# Patient Record
Sex: Male | Born: 1975 | Race: White | Hispanic: No | Marital: Single | Smoking: Never smoker
Health system: Southern US, Community
[De-identification: ages and names within clinical notes are randomized; demographics above are authoritative.]

## PROBLEM LIST (undated history)

## (undated) DIAGNOSIS — H9313 Tinnitus, bilateral: Secondary | ICD-10-CM

## (undated) DIAGNOSIS — H9190 Unspecified hearing loss, unspecified ear: Secondary | ICD-10-CM

## (undated) DIAGNOSIS — E119 Type 2 diabetes mellitus without complications: Secondary | ICD-10-CM

## (undated) DIAGNOSIS — I10 Essential (primary) hypertension: Secondary | ICD-10-CM

## (undated) DIAGNOSIS — R42 Dizziness and giddiness: Secondary | ICD-10-CM

## (undated) HISTORY — DX: Unspecified hearing loss, unspecified ear: H91.90

## (undated) HISTORY — PX: CARDIAC ELECTROPHYSIOLOGY MAPPING AND ABLATION: SHX1292

## (undated) HISTORY — DX: Type 2 diabetes mellitus without complications: E11.9

## (undated) HISTORY — DX: Tinnitus, bilateral: H93.13

## (undated) HISTORY — DX: Dizziness and giddiness: R42

---

## 2008-03-24 ENCOUNTER — Emergency Department (HOSPITAL_COMMUNITY): Admission: EM | Admit: 2008-03-24 | Discharge: 2008-03-24 | Payer: Self-pay | Admitting: Emergency Medicine

## 2009-03-16 ENCOUNTER — Ambulatory Visit (HOSPITAL_COMMUNITY): Admission: RE | Admit: 2009-03-16 | Discharge: 2009-03-16 | Payer: Self-pay | Admitting: Orthopaedic Surgery

## 2011-09-17 LAB — URIC ACID: Uric Acid, Serum: 9 — ABNORMAL HIGH

## 2012-07-26 ENCOUNTER — Encounter (HOSPITAL_COMMUNITY): Payer: Self-pay | Admitting: Emergency Medicine

## 2012-07-26 ENCOUNTER — Emergency Department (HOSPITAL_COMMUNITY)
Admission: EM | Admit: 2012-07-26 | Discharge: 2012-07-26 | Disposition: A | Discharge status: Home / Self Care | Payer: BC Managed Care – PPO | Attending: Emergency Medicine | Admitting: Emergency Medicine

## 2012-07-26 ENCOUNTER — Emergency Department (HOSPITAL_COMMUNITY): Payer: BC Managed Care – PPO

## 2012-07-26 DIAGNOSIS — M109 Gout, unspecified: Secondary | ICD-10-CM

## 2012-07-26 LAB — URIC ACID: Uric Acid, Serum: 10.8 mg/dL — ABNORMAL HIGH (ref 4.0–7.8)

## 2012-07-26 MED ORDER — DEXAMETHASONE SODIUM PHOSPHATE 10 MG/ML IJ SOLN
10.0000 mg | Freq: Once | INTRAMUSCULAR | Status: AC
  Administered 2012-07-26: 10 mg via INTRAMUSCULAR
  Filled 2012-07-26: qty 1

## 2012-07-26 MED ORDER — HYDROCODONE-ACETAMINOPHEN 5-325 MG PO TABS: 1.0000 | ORAL_TABLET | ORAL | Status: AC | PRN

## 2012-07-26 MED ORDER — HYDROCODONE-ACETAMINOPHEN 5-325 MG PO TABS
1.0000 | ORAL_TABLET | Freq: Once | ORAL | Status: AC
  Administered 2012-07-26: 1 via ORAL
  Filled 2012-07-26: qty 1

## 2012-07-26 MED ORDER — PREDNISONE 10 MG PO TABS: ORAL_TABLET | ORAL | Status: DC

## 2012-07-26 NOTE — ED Notes (Signed)
C/o r wrist pain x 3 days. Denies injury but has been playing golf a lot. Slight posterior wrist swelling noted. Radial pulses strong. Cap refill imeediate. Nad.

## 2012-07-26 NOTE — ED Notes (Signed)
Pt c/o pain and swelling to his right wrist for 2 days. Denies injury. Strong pulse palpated. Pt c/o pain with movement. Pt alert and oriented x 3. Skin warm and dry. Color pink. No acute distress.

## 2012-07-26 NOTE — ED Provider Notes (Signed)
History     CSN: [id number]  Arrival date & time 07/26/12  1326   First MD Initiated Contact with Patient 07/26/12 1435      Chief Complaint  Patient presents with  . Wrist Pain    (Consider location/radiation/quality/duration/timing/severity/associated sxs/prior treatment) HPI Comments: Brett Rosario presents with right wrist pain, swelling and increasing redness over the past 3 days.  He denies injury, but has been on vacation, and playing more golf this week. Pain is constant and achy.  He does possibly have a history of gout, stating a history of swelling in his left ankle which has been intermittent along with prior elevated uric acid level.  He has taken aleve with no relief of symptoms.    The history is provided by the patient.    History reviewed. No pertinent past medical history.  History reviewed. No pertinent past surgical history.  History reviewed. No pertinent family history.  History  Substance Use Topics  . Smoking status: Never Smoker   . Smokeless tobacco: Not on file  . Alcohol Use: Yes     occasional      Review of Systems  Musculoskeletal: Positive for joint swelling and arthralgias.  Skin: Negative for wound.  Neurological: Negative for weakness and numbness.    Allergies  Review of patient's allergies indicates no known allergies.  Home Medications   Current Outpatient Rx  Name Route Sig Dispense Refill  . LISINOPRIL-HYDROCHLOROTHIAZIDE 20-12.5 MG PO TABS Oral Take 1 tablet by mouth daily.    Marland Kitchen NAPROXEN SODIUM 220 MG PO TABS Oral Take 220 mg by mouth every 8 (eight) hours as needed. For pain    . SERTRALINE HCL 50 MG PO TABS Oral Take 75 mg by mouth daily.    Marland Kitchen HYDROCODONE-ACETAMINOPHEN 5-325 MG PO TABS Oral Take 1 tablet by mouth every 4 (four) hours as needed for pain. 15 tablet 0  . PREDNISONE 10 MG PO TABS  6, 5, 4, 3, 2 then 1 tablet by mouth daily for 6 days total. 21 tablet 0    BP 131/75  Pulse 84  Temp 98.2 F (36.8 C)   Resp 17  SpO2 94%  Physical Exam  Constitutional: He appears well-developed and well-nourished.  HENT:  Head: Atraumatic.  Neck: Normal range of motion.  Cardiovascular:       Pulses equal bilaterally  Musculoskeletal: He exhibits edema and tenderness.       Right wrist: He exhibits tenderness and swelling.       Slight edema,  Ttp, increased erythema over right dorsal wrist.  Pt does display extension and flexion,  Although flexion is reduced secondary to pain.  Cap refill is less than 3 seconds.  He has no rash, no punctures, abrasions or other sign of skin trauma.    Neurological: He is alert. He has normal strength. He displays normal reflexes. No sensory deficit.       Equal strength  Skin: Skin is warm and dry.  Psychiatric: He has a normal mood and affect.    ED Course  Procedures (including critical care time)  Labs Reviewed  URIC ACID - Abnormal; Notable for the following:    Uric Acid, Serum 10.8 (*)     All other components within normal limits   Dg Wrist Complete Right  07/26/2012  *RADIOLOGY REPORT*  Clinical Data: Wrist pain  RIGHT WRIST - COMPLETE 3+ VIEW  Comparison: None  Findings: There is no evidence of fracture or dislocation.  There is  no evidence of arthropathy or other focal bone abnormality. Soft tissues are unremarkable.  IMPRESSION: Negative exam.  Original Report Authenticated By: Rosealee Albee, M.D.     1. Gout    Pt given decadron 10 mg IM, hydrocodone tablet prior to dc home.   MDM  Pt prescribed 6 day prednisone taper to start tomorrow, hydrocodone.  Encouraged heat therapy, elevation, rest.  Referral for pcp.  In interim, pt needs return here for any worsened sx.  No risk factors for septic joint.   Labs reviewed and discussed with pt.        Burgess Amor, [place] 07/26/12 2219

## 2012-07-30 NOTE — ED Provider Notes (Signed)
Medical screening examination/treatment/procedure(s) were performed by non-physician practitioner and as supervising physician I was immediately available for consultation/collaboration.   Joya Gaskins, MD 07/30/12 [phone]

## 2015-08-30 ENCOUNTER — Emergency Department (HOSPITAL_COMMUNITY)
Admission: EM | Admit: 2015-08-30 | Discharge: 2015-08-30 | Disposition: A | Discharge status: Home / Self Care | Payer: BLUE CROSS/BLUE SHIELD | Attending: Emergency Medicine | Admitting: Emergency Medicine

## 2015-08-30 ENCOUNTER — Encounter (HOSPITAL_COMMUNITY): Payer: Self-pay | Admitting: *Deleted

## 2015-08-30 DIAGNOSIS — M5412 Radiculopathy, cervical region: Secondary | ICD-10-CM | POA: Diagnosis not present

## 2015-08-30 DIAGNOSIS — I10 Essential (primary) hypertension: Secondary | ICD-10-CM | POA: Insufficient documentation

## 2015-08-30 DIAGNOSIS — M79602 Pain in left arm: Secondary | ICD-10-CM | POA: Diagnosis present

## 2015-08-30 DIAGNOSIS — Z79899 Other long term (current) drug therapy: Secondary | ICD-10-CM | POA: Insufficient documentation

## 2015-08-30 DIAGNOSIS — M25512 Pain in left shoulder: Secondary | ICD-10-CM | POA: Insufficient documentation

## 2015-08-30 HISTORY — DX: Essential (primary) hypertension: I10

## 2015-08-30 MED ORDER — DEXAMETHASONE SODIUM PHOSPHATE 10 MG/ML IJ SOLN
10.0000 mg | Freq: Once | INTRAMUSCULAR | Status: AC
  Administered 2015-08-30: 10 mg via INTRAMUSCULAR
  Filled 2015-08-30: qty 1

## 2015-08-30 MED ORDER — PREDNISONE 20 MG PO TABS: ORAL_TABLET | ORAL | Status: DC

## 2015-08-30 MED ORDER — HYDROCODONE-ACETAMINOPHEN 5-325 MG PO TABS: 1.0000 | ORAL_TABLET | ORAL | Status: DC | PRN

## 2015-08-30 NOTE — Discharge Instructions (Signed)

## 2015-08-30 NOTE — ED Provider Notes (Signed)
CSN: [id number]     Arrival date & time 08/30/15  0900 History  This chart was scribed for Gilda Crease, MD by Marica Otter, ED Scribe. This patient was seen in room APA08/APA08 and the patient's care was started at 9:34 AM.  Chief Complaint  Patient presents with  . Arm Pain   The history is provided by the patient. No language interpreter was used.   PCP: [facility] HPI Comments: Brett Rosario is a 39 y.o. male, with PMHx noted below, who presents to the Emergency Department complaining of atraumatic, intermittent, severe, 10/10, burning left arm pain and intermittent, improving neck pain onset 2 weeks ago. Pt reports his PCP treated him for the same with muscle relaxer-- pt reports while the muscle relaxer afforded temporary relief, his Sx returned. Thereafter, pt went to the chiropractor who was able to help his neck pain but his arm pain remains unchanged. Pt further reports icing the affected areas at home with some relief and ingesting ibuprofen at home without relief. Pt reports he has an appointment with ortho on October 4th.   Past Medical History  Diagnosis Date  . Hypertension    History reviewed. No pertinent past surgical history. No family history on file. Social History  Substance Use Topics  . Smoking status: Never Smoker   . Smokeless tobacco: None  . Alcohol Use: Yes     Comment: occasional    Review of Systems  Constitutional: Negative for fever and chills.  Musculoskeletal: Positive for arthralgias (left arm pain) and neck pain.  All other systems reviewed and are negative.  Allergies  Review of patient's allergies indicates no known allergies.  Home Medications   Prior to Admission medications   Medication Sig Start Date End Date Taking? Authorizing Provider  HYDROcodone-acetaminophen (NORCO/VICODIN) 5-325 MG per tablet Take 1-2 tablets by mouth every 4 (four) hours as needed for moderate pain. 08/30/15   Gilda Crease, MD  lisinopril-hydrochlorothiazide (PRINZIDE,ZESTORETIC) 20-12.5 MG per tablet Take 1 tablet by mouth daily.    Historical Provider, MD  naproxen sodium (ANAPROX) 220 MG tablet Take 220 mg by mouth every 8 (eight) hours as needed. For pain    Historical Provider, MD  predniSONE (DELTASONE) 20 MG tablet 3 tabs po daily x 3 days, then 2 tabs x 3 days, then 1.5 tabs x 3 days, then 1 tab x 3 days, then 0.5 tabs x 3 days 08/30/15   Gilda Crease, MD  sertraline (ZOLOFT) 50 MG tablet Take 75 mg by mouth daily.    Historical Provider, MD   Triage Vitals: BP 147/98 mmHg  Pulse 77  Temp(Src) 98.1 F (36.7 C) (Oral)  Resp 16  Ht 6' (1.829 m)  Wt 227 lb (102.967 kg)  BMI 30.78 kg/m2  SpO2 100% Physical Exam  Constitutional: He is oriented to person, place, and time. He appears well-developed and well-nourished. No distress.  HENT:  Head: Normocephalic and atraumatic.  Right Ear: Hearing normal.  Left Ear: Hearing normal.  Nose: Nose normal.  Mouth/Throat: Oropharynx is clear and moist and mucous membranes are normal.  Eyes: Conjunctivae and EOM are normal. Pupils are equal, round, and reactive to light.  Neck: Normal range of motion. Neck supple.  Cardiovascular: Regular rhythm, S1 normal and S2 normal.  Exam reveals no gallop and no friction rub.   No murmur heard. Pulmonary/Chest: Effort normal and breath sounds normal. No respiratory distress. He exhibits no tenderness.  Abdominal: Soft. Normal appearance  and bowel sounds are normal. There is no hepatosplenomegaly. There is no tenderness. There is no rebound, no guarding, no tenderness at McBurney's point and negative Murphy's sign. No hernia.  Musculoskeletal: Normal range of motion. He exhibits tenderness.  Left paraspinal neck tenderness, decreased ROM of left shoulder due to pain.   Neurological: He is alert and oriented to person, place, and time. He has normal strength. No cranial nerve deficit or sensory deficit.  Coordination normal. GCS eye subscore is 4. GCS verbal subscore is 5. GCS motor subscore is 6.  Skin: Skin is warm, dry and intact. No rash noted. No cyanosis.  Psychiatric: He has a normal mood and affect. His speech is normal and behavior is normal. Thought content normal.  Nursing note and vitals reviewed.   ED Course  Procedures (including critical care time) DIAGNOSTIC STUDIES: Oxygen Saturation is 100% on RA, nl by my interpretation.    COORDINATION OF CARE: 9:41 AM-Discussed treatment plan with pt at bedside and pt agreed to plan.   Labs Review Labs Reviewed - No data to display  Imaging Review No results found. I have personally reviewed and evaluated these images and lab results as part of my medical decision-making.   EKG Interpretation None      MDM   Final diagnoses:  Cervical radiculopathy    Presents with two-week history of pain in the left side of his neck, shoulder and arm. Patient seen by his doctor, prescribed muscle relaxers without improvement. He has been seeing a chiropractor who told him he has a pinched nerve. History does seem consistent with cervical radiculopathy. She reports that his neck pain has improved. He does not have any posterior neck pain, pain does, however, slightly worsen with movements of the neck. There are no neurologic deficits. Patient reports numbness of the index finger, but there is no sensory or motor deficit on examination. He has normal biceps and triceps tendon reflexes. There is nothing to indicate surgical emergency. Patient will be treated with rest, prednisone, analgesia. He will need follow-up in 1-2 weeks for recheck and at that time consideration for MRI if not improving.  I personally performed the services described in this documentation, which was scribed in my presence. The recorded information has been reviewed and is accurate.     Gilda Crease, MD 08/30/15 1011

## 2015-08-30 NOTE — ED Notes (Signed)
Pt comes in for left arm and neck pain. Pain started 2 weeks ago. Pt was seen by PCP and given muscle relaxers and sent to chiropractor with no relief. Pt has appt with orthopedic doctor in Oct but pt has increased pain today. NAD noted.

## 2016-01-15 ENCOUNTER — Encounter (HOSPITAL_COMMUNITY): Payer: Self-pay | Admitting: Emergency Medicine

## 2016-01-15 ENCOUNTER — Emergency Department (HOSPITAL_COMMUNITY)
Admission: EM | Admit: 2016-01-15 | Discharge: 2016-01-15 | Disposition: A | Discharge status: Home / Self Care | Payer: BLUE CROSS/BLUE SHIELD | Attending: Emergency Medicine | Admitting: Emergency Medicine

## 2016-01-15 DIAGNOSIS — D649 Anemia, unspecified: Secondary | ICD-10-CM | POA: Diagnosis not present

## 2016-01-15 DIAGNOSIS — Z79899 Other long term (current) drug therapy: Secondary | ICD-10-CM | POA: Diagnosis not present

## 2016-01-15 DIAGNOSIS — R42 Dizziness and giddiness: Secondary | ICD-10-CM | POA: Insufficient documentation

## 2016-01-15 DIAGNOSIS — H9192 Unspecified hearing loss, left ear: Secondary | ICD-10-CM | POA: Insufficient documentation

## 2016-01-15 DIAGNOSIS — R110 Nausea: Secondary | ICD-10-CM | POA: Insufficient documentation

## 2016-01-15 DIAGNOSIS — I10 Essential (primary) hypertension: Secondary | ICD-10-CM | POA: Insufficient documentation

## 2016-01-15 LAB — CBC WITH DIFFERENTIAL/PLATELET
Basophils Absolute: 0 10*3/uL (ref 0.0–0.1)
Basophils Relative: 1 %
Eosinophils Absolute: 0.2 10*3/uL (ref 0.0–0.7)
Eosinophils Relative: 2 %
HCT: 33.4 % — ABNORMAL LOW (ref 39.0–52.0)
Hemoglobin: 11 g/dL — ABNORMAL LOW (ref 13.0–17.0)
Lymphocytes Relative: 29 %
Lymphs Abs: 2 10*3/uL (ref 0.7–4.0)
MCH: 25.7 pg — ABNORMAL LOW (ref 26.0–34.0)
MCHC: 32.9 g/dL (ref 30.0–36.0)
MCV: 78 fL (ref 78.0–100.0)
Monocytes Absolute: 0.6 10*3/uL (ref 0.1–1.0)
Monocytes Relative: 9 %
Neutro Abs: 4.1 10*3/uL (ref 1.7–7.7)
Neutrophils Relative %: 59 %
Platelets: 207 10*3/uL (ref 150–400)
RBC: 4.28 MIL/uL (ref 4.22–5.81)
RDW: 14.6 % (ref 11.5–15.5)
WBC: 6.9 10*3/uL (ref 4.0–10.5)

## 2016-01-15 LAB — BASIC METABOLIC PANEL
Anion gap: 12 (ref 5–15)
BUN: 11 mg/dL (ref 6–20)
CO2: 20 mmol/L — ABNORMAL LOW (ref 22–32)
Calcium: 8.6 mg/dL — ABNORMAL LOW (ref 8.9–10.3)
Chloride: 102 mmol/L (ref 101–111)
Creatinine, Ser: 0.64 mg/dL (ref 0.61–1.24)
GFR calc Af Amer: >60 mL/min (ref >60)
GFR calc non Af Amer: >60 mL/min (ref >60)
Glucose, Bld: 199 mg/dL — ABNORMAL HIGH (ref 65–99)
Potassium: 3.7 mmol/L (ref 3.5–5.1)
Sodium: 134 mmol/L — ABNORMAL LOW (ref 135–145)

## 2016-01-15 MED ORDER — FERROUS SULFATE 325 (65 FE) MG PO TABS: 325.0000 mg | ORAL_TABLET | Freq: Every day | ORAL | Status: DC

## 2016-01-15 NOTE — ED Provider Notes (Signed)
CSN: [id number]     Arrival date & time 01/15/16  1949 History   First MD Initiated Contact with Patient 01/15/16 2020     Chief Complaint  Patient presents with  . Dizziness   (Consider location/radiation/quality/duration/timing/severity/associated sxs/prior Treatment) Patient is a 40 y.o. male presenting with dizziness. The history is provided by the patient. No language interpreter was used.  Dizziness Associated symptoms: nausea   Associated symptoms: no headaches and no vomiting    Brett Rosario is a 40 y.o male with a history of hypertension who presents for intermittent dizziness for the past several months. He states that he thought it might be fluid in his ears because he had his ears checked by a nurse at work. She told him to take allergy medicine and he stated he took it for a week but it did not help. He also reports that he drinks lots of Powerade and stop drinking them a couple days ago and then went online and googled his symptoms. He states that he became anxious which prompted him to come in tonight. He also reported that his daughter was showing him a watch and he bent over and became dizzy and nauseated a couple of hours ago. 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 He should states that he is not dizzy now.  He denies any recent illness, fever, chills, ringing in the ears, headache, lightheadedness, syncope, chest pain, shortness of breath, abdominal pain, vomiting, diarrhea.    Past Medical History  Diagnosis Date  . Hypertension    History reviewed. No pertinent past surgical history. No family history on file. Social History  Substance Use Topics  . Smoking status: Never Smoker   . Smokeless tobacco: Current User    Types: Snuff  . Alcohol Use: Yes     Comment: occasional    Review of Systems  Gastrointestinal: Positive for nausea. Negative for vomiting.  Neurological: Positive for  dizziness. Negative for headaches.  All other systems reviewed and are negative.     Allergies  Review of patient's allergies indicates no known allergies.  Home Medications   Prior to Admission medications   Medication Sig Start Date End Date Taking? Authorizing Provider  ferrous sulfate 325 (65 FE) MG tablet Take 1 tablet (325 mg total) by mouth daily. 01/15/16   Jeanne Terrance Patel-Mills, PA-C  HYDROcodone-acetaminophen (NORCO/VICODIN) 5-325 MG per tablet Take 1-2 tablets by mouth every 4 (four) hours as needed for moderate pain. 08/30/15   Gilda Crease, MD  lisinopril-hydrochlorothiazide (PRINZIDE,ZESTORETIC) 20-12.5 MG per tablet Take 1 tablet by mouth daily.    Historical Provider, MD  naproxen sodium (ANAPROX) 220 MG tablet Take 220 mg by mouth every 8 (eight) hours as needed. For pain    Historical Provider, MD  predniSONE (DELTASONE) 20 MG tablet 3 tabs po daily x 3 days, then 2 tabs x 3 days, then 1.5 tabs x 3 days, then 1 tab x 3 days, then 0.5 tabs x 3 days 08/30/15   Gilda Crease, MD  sertraline (ZOLOFT) 50 MG tablet Take 75 mg by mouth daily.    Historical Provider, MD   BP 140/77 mmHg  Pulse 79  Temp(Src) 98.2 F (36.8 C) (Oral)  Resp 15  Ht 6' (1.829 m)  Wt 104.327 kg  BMI 31.19 kg/m2  SpO2 98% Physical Exam  Constitutional: He is oriented to person, place, and time. He appears well-developed and  well-nourished.  HENT:  Head: Normocephalic and atraumatic.  Bilateral TM's and canals are normal. No air-fluid level.  Eyes: Conjunctivae are normal.  Neck: Normal range of motion. Neck supple.  Cardiovascular: Normal rate, regular rhythm and normal heart sounds.   Regular rate and rhythm. No murmur. Rhythm strip on monitor is normal at bedside. Lungs clear to auscultation bilaterally.  Pulmonary/Chest: Effort normal and breath sounds normal. No respiratory distress. He has no wheezes. He has no rales.  Abdominal: Soft. There is no tenderness.  No abdominal  tenderness to palpation.  Musculoskeletal: Normal range of motion.  Neurological: He is alert and oriented to person, place, and time.  Skin: Skin is warm and dry.  Psychiatric: He has a normal mood and affect.  Nursing note and vitals reviewed.   ED Course  Procedures (including critical care time) Labs Review Labs Reviewed  CBC WITH DIFFERENTIAL/PLATELET - Abnormal; Notable for the following:    Hemoglobin 11.0 (*)    HCT 33.4 (*)    MCH 25.7 (*)    All other components within normal limits  BASIC METABOLIC PANEL - Abnormal; Notable for the following:    Sodium 134 (*)    CO2 20 (*)    Glucose, Bld 199 (*)    Calcium 8.6 (*)    All other components within normal limits    Imaging Review No results found. I have personally reviewed and evaluated these lab results as part of my medical decision-making.   EKG Interpretation   Date/Time:  Monday January 15 2016 20:06:40 EST Ventricular Rate:  76 PR Interval:  143 QRS Duration: 102 QT Interval:  366 QTC Calculation: 411 R Axis:   63 Text Interpretation:  Sinus rhythm Baseline wander in lead(s) V6  nonspecific T wave inversion III No previous ECGs available Confirmed by  Manus Gunning  MD, STEPHEN [phone]) on 01/15/2016 10:03:51 PM      MDM   Final diagnoses:  Anemia, unspecified anemia type  Dizziness   Patient presents for intermittent dizziness times several months. He reports nausea but no other symptoms. He denies any rectal bleeding. States he has hypertension but no other medical problems. He does have a primary care physician. His exam today was normal. His vital signs and orthostatic vitals are normal. EKG was normal. Orthostatic VS for the past 24 hrs (Last 3 readings):  BP- Lying Pulse- Lying BP- Sitting Pulse- Sitting BP- Standing at 0 minutes Pulse- Standing at 0 minutes  01/15/16 2130 137/80 mmHg 79 141/79 mmHg 85 136/75 mmHg 86  His hgb is 11.0 but otherwise labs are not concerning. Glucose is 199 but patient  states he ate prior to coming into the ED. I will prescribe ferrous sulfate. I discussed findings with the patient. Patient agrees with following up with his primary care physician. Return precautions were discussed and patient agrees with plan.     Catha Gosselin, PA-C 01/16/16 6045  Raeford Razor, MD 01/17/16 1340

## 2016-01-15 NOTE — Discharge Instructions (Signed)
Anemia, Nonspecific Follow-up with her primary care physician. Your hemoglobin today was 11.0. Take iron daily. This may cause constipation. You can take miralax. Anemia is a condition in which the concentration of red blood cells or hemoglobin in the blood is below normal. Hemoglobin is a substance in red blood cells that carries oxygen to the tissues of the body. Anemia results in not enough oxygen reaching these tissues.  CAUSES  Common causes of anemia include:   Excessive bleeding. Bleeding may be internal or external. This includes excessive bleeding from periods (in women) or from the intestine.   Poor nutrition.   Chronic kidney, thyroid, and liver disease.  Bone marrow disorders that decrease red blood cell production.  Cancer and treatments for cancer.  HIV, AIDS, and their treatments.  Spleen problems that increase red blood cell destruction.  Blood disorders.  Excess destruction of red blood cells due to infection, medicines, and autoimmune disorders. SIGNS AND SYMPTOMS   Minor weakness.   Dizziness.   Headache.  Palpitations.   Shortness of breath, especially with exercise.   Paleness.  Cold sensitivity.  Indigestion.  Nausea.  Difficulty sleeping.  Difficulty concentrating. Symptoms may occur suddenly or they may develop slowly.  DIAGNOSIS  Additional blood tests are often needed. These help your health care provider determine the best treatment. Your health care provider will check your stool for blood and look for other causes of blood loss.  TREATMENT  Treatment varies depending on the cause of the anemia. Treatment can include:   Supplements of iron, vitamin B12, or folic acid.   Hormone medicines.   A blood transfusion. This may be needed if blood loss is severe.   Hospitalization. This may be needed if there is significant continual blood loss.   Dietary changes.  Spleen removal. HOME CARE INSTRUCTIONS Keep all follow-up  appointments. It often takes many weeks to correct anemia, and having your health care provider check on your condition and your response to treatment is very important. SEEK IMMEDIATE MEDICAL CARE IF:   You develop extreme weakness, shortness of breath, or chest pain.   You become dizzy or have trouble concentrating.  You develop heavy vaginal bleeding.   You develop a rash.   You have bloody or black, tarry stools.   You faint.   You vomit up blood.   You vomit repeatedly.   You have abdominal pain.  You have a fever or persistent symptoms for more than 2-3 days.   You have a fever and your symptoms suddenly get worse.   You are dehydrated.  MAKE SURE YOU:  Understand these instructions.  Will watch your condition.  Will get help right away if you are not doing well or get worse.   This information is not intended to replace advice given to you by your health care provider. Make sure you discuss any questions you have with your health care provider.   Document Released: 01/16/2005 Document Revised: 08/11/2013 Document Reviewed: 06/04/2013 Elsevier Interactive Patient Education [employer]

## 2016-01-15 NOTE — ED Notes (Signed)
Over the last several months had episodes of dizziness, pt treated last week at work by nurse with allergies medication for fluid behind ears. Pt states episodes has continued, head feels fun, dizziness, nausea.

## 2016-02-07 ENCOUNTER — Ambulatory Visit (INDEPENDENT_AMBULATORY_CARE_PROVIDER_SITE_OTHER): Payer: BLUE CROSS/BLUE SHIELD | Admitting: Internal Medicine

## 2016-02-13 ENCOUNTER — Ambulatory Visit (INDEPENDENT_AMBULATORY_CARE_PROVIDER_SITE_OTHER): Payer: BLUE CROSS/BLUE SHIELD | Admitting: Internal Medicine

## 2016-02-26 ENCOUNTER — Ambulatory Visit (INDEPENDENT_AMBULATORY_CARE_PROVIDER_SITE_OTHER): Payer: BLUE CROSS/BLUE SHIELD | Admitting: Internal Medicine

## 2016-02-26 ENCOUNTER — Encounter (INDEPENDENT_AMBULATORY_CARE_PROVIDER_SITE_OTHER): Payer: Self-pay | Admitting: Internal Medicine

## 2016-02-26 VITALS — BP 142/74 | HR 80 | Temp 98.0°F | Ht 72.0 in | Wt 230.4 lb

## 2016-02-26 DIAGNOSIS — D508 Other iron deficiency anemias: Secondary | ICD-10-CM

## 2016-02-26 DIAGNOSIS — K625 Hemorrhage of anus and rectum: Secondary | ICD-10-CM | POA: Diagnosis not present

## 2016-02-26 NOTE — Progress Notes (Signed)
   Subjective:    Patient ID: Brett Rosario, male    DOB: 04/12/1976, 40 y.o.   MRN: [id number]  [facility]Dr. Francene BoyersSusan Peterson)  for rectal bleeding. He tells me his iron studies were low.  He tells me when he strains he has seen blood. Last episode was a couple of weeks ago.  Appetite is good. No weight loss. There is no abdominal pain. He has a BM daily. No family hx of colon cancer. Parents have had colon polyps.   Has been off iron for 3-4 weeks  01/31/2016 H and H 11.9 and 37.7, MCV 79.1, Platelet ct 214.  CBC    Component Value Date/Time   WBC 6.9 01/15/2016 2025   RBC 4.28 01/15/2016 2025   HGB 11.0* 01/15/2016 2025   HCT 33.4* 01/15/2016 2025   PLT 207 01/15/2016 2025   MCV 78.0 01/15/2016 2025   MCH 25.7* 01/15/2016 2025   MCHC 32.9 01/15/2016 2025   RDW 14.6 01/15/2016 2025   LYMPHSABS 2.0 01/15/2016 2025   MONOABS 0.6 01/15/2016 2025   EOSABS 0.2 01/15/2016 2025   BASOSABS 0.0 01/15/2016 2025         Review of Systems Past Medical History  Diagnosis Date  . Hypertension     Past Surgical History  Procedure Laterality Date  . Cardiac electrophysiology mapping and ablation      in his 5720s for a fast heart rate    No Known Allergies  Current Outpatient Prescriptions on File Prior to Visit  Medication Sig Dispense Refill  . lisinopril-hydrochlorothiazide (PRINZIDE,ZESTORETIC) 20-12.5 MG per tablet Take 1 tablet by mouth daily.    . sertraline (ZOLOFT) 50 MG tablet Take 75 mg by mouth daily.     No current facility-administered medications on file prior to visit.        Objective:   Physical ExamBlood pressure 142/74, pulse 80, temperature 98 F (36.7 C), height 6' (1.829 m), weight 230 lb 6.4 oz (104.509 kg). Alert and oriented. Skin warm and dry. Oral mucosa is moist.   . Sclera anicteric, conjunctivae is pink. Thyroid not enlarged. No cervical lymphadenopathy. Lungs clear. Heart regular rate and rhythm.  Abdomen is soft.  Bowel sounds are positive. No hepatomegaly. No abdominal masses felt. No tenderness.  No edema to lower extremities.  Stool brown and guaiac positive.    Lot [id number] Ex 9/17      Assessment & Plan:  Anemia. ? Iron deficiency anemia. He was guaiac positive in office today.  Will get iron studies first.  Will schedule a colonoscopy. The risks and benefits such as perforation, bleeding, and infection were reviewed with the patient and is agreeable. Needs to take a stool softener daily and increase fiber in his diet.

## 2016-02-26 NOTE — Patient Instructions (Signed)
Colonoscopy.  The risks and benefits such as perforation, bleeding, and infection were reviewed with the patient and is agreeable. 

## 2016-02-27 ENCOUNTER — Other Ambulatory Visit (INDEPENDENT_AMBULATORY_CARE_PROVIDER_SITE_OTHER): Payer: Self-pay | Admitting: Internal Medicine

## 2016-02-27 ENCOUNTER — Other Ambulatory Visit (INDEPENDENT_AMBULATORY_CARE_PROVIDER_SITE_OTHER): Payer: Self-pay | Admitting: *Deleted

## 2016-02-27 ENCOUNTER — Encounter (INDEPENDENT_AMBULATORY_CARE_PROVIDER_SITE_OTHER): Payer: Self-pay | Admitting: *Deleted

## 2016-02-27 DIAGNOSIS — R195 Other fecal abnormalities: Secondary | ICD-10-CM

## 2016-02-27 DIAGNOSIS — K625 Hemorrhage of anus and rectum: Secondary | ICD-10-CM

## 2016-02-27 LAB — CBC WITH DIFFERENTIAL/PLATELET
Basophils Absolute: 0 10*3/uL (ref 0.0–0.1)
Basophils Relative: 0 % (ref 0–1)
Eosinophils Absolute: 0.2 10*3/uL (ref 0.0–0.7)
Eosinophils Relative: 3 % (ref 0–5)
HCT: 38.2 % — ABNORMAL LOW (ref 39.0–52.0)
Hemoglobin: 12.6 g/dL — ABNORMAL LOW (ref 13.0–17.0)
Lymphocytes Relative: 22 % (ref 12–46)
Lymphs Abs: 1.7 10*3/uL (ref 0.7–4.0)
MCH: 26.2 pg (ref 26.0–34.0)
MCHC: 33 g/dL (ref 30.0–36.0)
MCV: 79.4 fL (ref 78.0–100.0)
MPV: 10.6 fL (ref 8.6–12.4)
Monocytes Absolute: 0.6 10*3/uL (ref 0.1–1.0)
Monocytes Relative: 8 % (ref 3–12)
Neutro Abs: 5.2 10*3/uL (ref 1.7–7.7)
Neutrophils Relative %: 67 % (ref 43–77)
Platelets: 243 10*3/uL (ref 150–400)
RBC: 4.81 MIL/uL (ref 4.22–5.81)
RDW: 18.1 % — ABNORMAL HIGH (ref 11.5–15.5)
WBC: 7.7 10*3/uL (ref 4.0–10.5)

## 2016-02-27 LAB — IRON AND TIBC
%SAT: 14 % — ABNORMAL LOW (ref 15–60)
Iron: 68 ug/dL (ref 50–180)
TIBC: 474 ug/dL — ABNORMAL HIGH (ref 250–425)
UIBC: 406 ug/dL — ABNORMAL HIGH (ref 125–400)

## 2016-02-27 LAB — FERRITIN: Ferritin: 12 ng/mL — ABNORMAL LOW (ref 20–345)

## 2016-02-27 MED ORDER — PEG 3350-KCL-NA BICARB-NACL 420 G PO SOLR: 4000.0000 mL | Freq: Once | ORAL | Status: DC

## 2016-02-27 NOTE — Telephone Encounter (Signed)
Patient needs trilyte 

## 2016-03-15 ENCOUNTER — Encounter (INDEPENDENT_AMBULATORY_CARE_PROVIDER_SITE_OTHER): Payer: Self-pay | Admitting: *Deleted

## 2016-03-20 ENCOUNTER — Ambulatory Visit (HOSPITAL_COMMUNITY)
Admission: RE | Admit: 2016-03-20 | Discharge: 2016-03-20 | Disposition: A | Discharge status: Home / Self Care | Payer: BLUE CROSS/BLUE SHIELD | Source: Ambulatory Visit | Attending: Internal Medicine | Admitting: Internal Medicine

## 2016-03-20 ENCOUNTER — Encounter (HOSPITAL_COMMUNITY): Payer: Self-pay | Admitting: *Deleted

## 2016-03-20 ENCOUNTER — Encounter (INDEPENDENT_AMBULATORY_CARE_PROVIDER_SITE_OTHER): Payer: Self-pay | Admitting: *Deleted

## 2016-03-20 ENCOUNTER — Encounter (HOSPITAL_COMMUNITY)
Admission: RE | Disposition: A | Discharge status: Home / Self Care | Payer: Self-pay | Source: Ambulatory Visit | Attending: Internal Medicine

## 2016-03-20 DIAGNOSIS — Z79899 Other long term (current) drug therapy: Secondary | ICD-10-CM | POA: Diagnosis not present

## 2016-03-20 DIAGNOSIS — K210 Gastro-esophageal reflux disease with esophagitis: Secondary | ICD-10-CM | POA: Diagnosis not present

## 2016-03-20 DIAGNOSIS — K644 Residual hemorrhoidal skin tags: Secondary | ICD-10-CM | POA: Diagnosis not present

## 2016-03-20 DIAGNOSIS — K625 Hemorrhage of anus and rectum: Secondary | ICD-10-CM | POA: Diagnosis not present

## 2016-03-20 DIAGNOSIS — K5730 Diverticulosis of large intestine without perforation or abscess without bleeding: Secondary | ICD-10-CM | POA: Diagnosis not present

## 2016-03-20 DIAGNOSIS — R195 Other fecal abnormalities: Secondary | ICD-10-CM | POA: Insufficient documentation

## 2016-03-20 DIAGNOSIS — Z720 Tobacco use: Secondary | ICD-10-CM | POA: Diagnosis not present

## 2016-03-20 DIAGNOSIS — Z8371 Family history of colonic polyps: Secondary | ICD-10-CM | POA: Insufficient documentation

## 2016-03-20 DIAGNOSIS — I10 Essential (primary) hypertension: Secondary | ICD-10-CM | POA: Insufficient documentation

## 2016-03-20 DIAGNOSIS — K219 Gastro-esophageal reflux disease without esophagitis: Secondary | ICD-10-CM | POA: Diagnosis not present

## 2016-03-20 DIAGNOSIS — D509 Iron deficiency anemia, unspecified: Secondary | ICD-10-CM | POA: Diagnosis not present

## 2016-03-20 HISTORY — PX: ESOPHAGOGASTRODUODENOSCOPY: SHX5428

## 2016-03-20 HISTORY — PX: COLONOSCOPY: SHX5424

## 2016-03-20 SURGERY — COLONOSCOPY
Anesthesia: Moderate Sedation

## 2016-03-20 MED ORDER — SODIUM CHLORIDE 0.9 % IV SOLN
INTRAVENOUS | Status: DC
  Administered 2016-03-20: 10:00:00 via INTRAVENOUS

## 2016-03-20 MED ORDER — STERILE WATER FOR IRRIGATION IR SOLN
Status: DC | PRN
  Administered 2016-03-20: 2.5 mL

## 2016-03-20 MED ORDER — MEPERIDINE HCL 50 MG/ML IJ SOLN
INTRAMUSCULAR | Status: DC
Start: 2016-03-20 — End: 2016-03-20
  Filled 2016-03-20: qty 1

## 2016-03-20 MED ORDER — MEPERIDINE HCL 50 MG/ML IJ SOLN
INTRAMUSCULAR | Status: DC | PRN
  Administered 2016-03-20 (×3): 25 mg via INTRAVENOUS

## 2016-03-20 MED ORDER — MIDAZOLAM HCL 5 MG/5ML IJ SOLN
INTRAMUSCULAR | Status: DC | PRN
  Administered 2016-03-20 (×2): 2 mg via INTRAVENOUS
  Administered 2016-03-20 (×2): 3 mg via INTRAVENOUS

## 2016-03-20 MED ORDER — BUTAMBEN-TETRACAINE-BENZOCAINE 2-2-14 % EX AERO
INHALATION_SPRAY | CUTANEOUS | Status: DC | PRN
  Administered 2016-03-20: 2 via TOPICAL

## 2016-03-20 MED ORDER — SODIUM CHLORIDE 0.9% FLUSH
INTRAVENOUS | Status: AC
  Filled 2016-03-20: qty 10

## 2016-03-20 MED ORDER — MEPERIDINE HCL 50 MG/ML IJ SOLN
INTRAMUSCULAR | Status: AC
  Filled 2016-03-20: qty 1

## 2016-03-20 MED ORDER — PROMETHAZINE HCL 25 MG/ML IJ SOLN
INTRAMUSCULAR | Status: AC
  Filled 2016-03-20: qty 1

## 2016-03-20 MED ORDER — MIDAZOLAM HCL 5 MG/5ML IJ SOLN
INTRAMUSCULAR | Status: AC
  Filled 2016-03-20: qty 10

## 2016-03-20 MED ORDER — PROMETHAZINE HCL 25 MG/ML IJ SOLN
INTRAMUSCULAR | Status: DC | PRN
  Administered 2016-03-20: 12.5 mg via INTRAVENOUS

## 2016-03-20 NOTE — Op Note (Signed)
[facility] Patient Name: Brett Rosario Procedure Date: 03/20/2016 11:22 AM MRN: [id number] Date of Birth: 06-May-1976 Attending MD: Lionel December , MD CSN: [id number] Age: 40 Admit Type: Outpatient Procedure:                Colonoscopy Indications:              Heme positive stool, Iron deficiency anemia Providers:                Lionel December, MD, Nena Polio, RN, Burke Keels,                            Technician Referring MD:             Marylynn Pearson, MD Medicines:                Meperidine 50 mg IV, Midazolam 10 mg IV,                            Promethazine 12.5 mg IV Complications:            No immediate complications. Estimated Blood Loss:     Estimated blood loss: none. Procedure:                Pre-Anesthesia Assessment:                           - Prior to the procedure, a History and Physical                            was performed, and patient medications and                            allergies were reviewed. The patient's tolerance of                            previous anesthesia was also reviewed. The risks                            and benefits of the procedure and the sedation                            options and risks were discussed with the patient.                            All questions were answered, and informed consent                            was obtained. Prior Anticoagulants: The patient has                            taken no previous anticoagulant or antiplatelet                            agents. ASA Grade Assessment: I - A normal, healthy  patient. After reviewing the risks and benefits,                            the patient was deemed in satisfactory condition to                            undergo the procedure.                           After obtaining informed consent, the colonoscope                            was passed under direct vision. Throughout the                            procedure, the patient's  blood pressure, pulse, and                            oxygen saturations were monitored continuously.The                            colonoscopy was performed without difficulty. The                            patient tolerated the procedure well. The quality                            of the bowel preparation was adequate. The terminal                            ileum, ileocecal valve, appendiceal orifice, and                            rectum were photographed. The [id number] ([id number])                            was introduced through the and advanced to the the                            terminal ileum. Scope In: 12:05:27 PM Scope Out: 12:19:40 PM Scope Withdrawal Time: 0 hours 11 minutes 4 seconds  Total Procedure Duration: 0 hours 14 minutes 13 seconds  Findings:      The terminal ileum appeared normal.      A few small-mouthed diverticula were found in the sigmoid colon and       hepatic flexure.      External hemorrhoids were found during retroflexion. The hemorrhoids       were small. Impression:               - The examined portion of the ileum was normal.                           - Diverticulosis in the sigmoid colon and at the  hepatic flexure.                           - External hemorrhoids.                           - No specimens collected. Moderate Sedation:      Moderate (conscious) sedation was administered by the endoscopy nurse       and supervised by the endoscopist. The following parameters were       monitored: oxygen saturation, heart rate, blood pressure, CO2       capnography and response to care. Total physician intraservice time was       25 minutes. Recommendation:           - Perform an upper GI endoscopy today.                           - Resume previous diet.                           - Continue present medications.                           - Patient has a contact number available for                            emergencies.  The signs and symptoms of potential                            delayed complications were discussed with the                            patient. Return to normal activities tomorrow.                            Written discharge instructions were provided to the                            patient.                           - Resume previous diet today.                           - Repeat colonoscopy at age 40 for screening                            purposes. Procedure Code(s):        --- Professional ---                           [phone], Moderate sedation services provided by the                            same physician or other qualified health care  professional performing the diagnostic or                            therapeutic service that the sedation supports,                            requiring the presence of an independent trained                            observer to assist in the monitoring of the                            patient's level of consciousness and physiological                            status; initial 15 minutes of intraservice time,                            patient age 65 years or older                           [phone], Moderate sedation services; each additional                            15 minutes intraservice time Diagnosis Code(s):        --- Professional ---                           K64.4, Residual hemorrhoidal skin tags                           R19.5, Other fecal abnormalities                           D50.9, Iron deficiency anemia, unspecified                           K57.30, Diverticulosis of large intestine without                            perforation or abscess without bleeding CPT copyright 2016 American Medical Association. All rights reserved. The codes documented in this report are preliminary and upon coder review may  be revised to meet current compliance requirements. Lionel December, MD Lionel December,  MD 03/20/2016 12:54:08 PM This report has been signed electronically. Number of Addenda: 0

## 2016-03-20 NOTE — H&P (Signed)
Brett Rosario is an 40 y.o. male.   Chief Complaint: Patient is here for colonoscopy and possible EGD. HPI: Asian is 40 year old Caucasian male who was recently found to have heme-positive stool and iron deficiency anemia when he presented with lightheadedness and fatigue. He has chronic GERD. Heartburn is well controlled with omeprazole. He has been on it for 3 years. He denies dysphagia abdominal pain melena or rectal bleeding other than occasional hematochezia in the form of blood on the tissue. He takes Aleve occasionally. Last dose was several weeks ago. He has good appetite and has not lost any weight. He has no history of diarrhea and/or constipation. Both parents have had colonic polyps removed. Family history is negative for CRC, IBD or celiac disease.  Past Medical History  Diagnosis Date  . Hypertension     Past Surgical History  Procedure Laterality Date  . Cardiac electrophysiology mapping and ablation      in his 4820s for a fast heart rate    History reviewed. No pertinent family history. Social History:  reports that he has never smoked. His smokeless tobacco use includes Snuff. He reports that he drinks alcohol. He reports that he does not use illicit drugs.  Allergies: No Known Allergies  Medications Prior to Admission  Medication Sig Dispense Refill  . lisinopril-hydrochlorothiazide (PRINZIDE,ZESTORETIC) 20-12.5 MG per tablet Take 1 tablet by mouth daily.    Marland Kitchen. omeprazole (PRILOSEC) 20 MG capsule Take 20 mg by mouth daily.    . polyethylene glycol-electrolytes (NULYTELY/GOLYTELY) 420 g solution Take 4,000 mLs by mouth once. 4000 mL 0  . sertraline (ZOLOFT) 50 MG tablet Take 75 mg by mouth daily.      No results found for this or any previous visit (from the past 48 hour(s)). No results found.  ROS  Blood pressure 134/81, pulse 70, temperature 97.6 F (36.4 C), temperature source Oral, resp. rate 13, height 6' (1.829 m), weight 230 lb (104.327 kg), SpO2 97  %. Physical Exam  Constitutional: He appears well-developed and well-nourished.  HENT:  Mouth/Throat: Oropharynx is clear and moist.  Eyes: Conjunctivae are normal. No scleral icterus.  Neck: No thyromegaly present.  Cardiovascular: Normal rate, regular rhythm and normal heart sounds.   No murmur heard. Respiratory: Effort normal and breath sounds normal.  GI: Soft. He exhibits no distension and no mass. There is no tenderness.  Musculoskeletal: He exhibits no edema.  Lymphadenopathy:    He has no cervical adenopathy.  Neurological: He is alert.  Skin: Skin is warm.     Assessment/Plan Heme-positive stool and iron deficiency anemia. Chronic GERD. Diagnostic colonoscopy to be followed by EGD if colonoscopy is normal.  REHMAN,NAJEEB U, MD 03/20/2016, 11:49 AM

## 2016-03-20 NOTE — Discharge Instructions (Signed)
Resume usual medications and diet. No driving for 24 hours. Physician will call with biopsy results and further recommendations..  Esophagogastroduodenoscopy, Care After Refer to this sheet in the next few weeks. These instructions provide you with information about caring for yourself after your procedure. Your health care provider may also give you more specific instructions. Your treatment has been planned according to current medical practices, but problems sometimes occur. Call your health care provider if you have any problems or questions after your procedure. WHAT TO EXPECT AFTER THE PROCEDURE After your procedure, it is typical to feel:  Soreness in your throat.  Pain with swallowing.  Sick to your stomach (nauseous).  Bloated.  Dizzy.  Fatigued. HOME CARE INSTRUCTIONS  Do not eat or drink anything until the numbing medicine (local anesthetic) has worn off and your gag reflex has returned. You will know that the local anesthetic has worn off when you can swallow comfortably.  Do not drive or operate machinery until directed by your health care provider.  Take medicines only as directed by your health care provider. SEEK MEDICAL CARE IF:   You cannot stop coughing.  You are not urinating at all or less than usual. SEEK IMMEDIATE MEDICAL CARE IF:  You have difficulty swallowing.  You cannot eat or drink.  You have worsening throat or chest pain.  You have dizziness or lightheadedness or you faint.  You have nausea or vomiting.  You have chills.  You have a fever.  You have severe abdominal pain.  You have black, tarry, or bloody stools.   This information is not intended to replace advice given to you by your health care provider. Make sure you discuss any questions you have with your health care provider.   Document Released: 11/25/2012 Document Revised: 12/30/2014 Document Reviewed: 11/25/2012 Elsevier Interactive Patient Education 2016 [employer]  Colonoscopy, Care After These instructions give you information on caring for yourself after your procedure. Your doctor may also give you more specific instructions. Call your doctor if you have any problems or questions after your procedure. HOME CARE  Do not drive for 24 hours.  Do not sign important papers or use machinery for 24 hours.  You may shower.  You may go back to your usual activities, but go slower for the first 24 hours.  Take rest breaks often during the first 24 hours.  Walk around or use warm packs on your belly (abdomen) if you have belly cramping or gas.  Drink enough fluids to keep your pee (urine) clear or pale yellow.  Resume your normal diet. Avoid heavy or fried foods.  Avoid drinking alcohol for 24 hours or as told by your doctor.  Only take medicines as told by your doctor. If a tissue sample (biopsy) was taken during the procedure:   Do not take aspirin or blood thinners for 7 days, or as told by your doctor.  Do not drink alcohol for 7 days, or as told by your doctor.  Eat soft foods for the first 24 hours. GET HELP IF: You still have a small amount of blood in your poop (stool) 2-3 days after the procedure. GET HELP RIGHT AWAY IF:  You have more than a small amount of blood in your poop.  You see clumps of tissue (blood clots) in your poop.  Your belly is puffy (swollen).  You feel sick to your stomach (nauseous) or throw up (vomit).  You have a fever.  You have belly pain that  gets worse and medicine does not help. MAKE SURE YOU:  Understand these instructions.  Will watch your condition.  Will get help right away if you are not doing well or get worse.   This information is not intended to replace advice given to you by your health care provider. Make sure you discuss any questions you have with your health care provider.   Document Released: 01/11/2011 Document Revised: 12/14/2013 Document Reviewed: 08/16/2013 Elsevier  Interactive Patient Education 2016 Elsevier Inc.    Moderate Conscious Sedation, Adult, Care After Refer to this sheet in the next few weeks. These instructions provide you with information on caring for yourself after your procedure. Your health care provider may also give you more specific instructions. Your treatment has been planned according to current medical practices, but problems sometimes occur. Call your health care provider if you have any problems or questions after your procedure. WHAT TO EXPECT AFTER THE PROCEDURE  After your procedure:  You may feel sleepy, clumsy, and have poor balance for several hours.  Vomiting may occur if you eat too soon after the procedure. HOME CARE INSTRUCTIONS  Do not participate in any activities where you could become injured for at least 24 hours. Do not:  Drive.  Swim.  Ride a bicycle.  Operate heavy machinery.  Cook.  Use power tools.  Climb ladders.  Work from a high place.  Do not make important decisions or sign legal documents until you are improved.  If you vomit, drink water, juice, or soup when you can drink without vomiting. Make sure you have little or no nausea before eating solid foods.  Only take over-the-counter or prescription medicines for pain, discomfort, or fever as directed by your health care provider.  Make sure you and your family fully understand everything about the medicines given to you, including what side effects may occur.  You should not drink alcohol, take sleeping pills, or take medicines that cause drowsiness for at least 24 hours.  If you smoke, do not smoke without supervision.  If you are feeling better, you may resume normal activities 24 hours after you were sedated.  Keep all appointments with your health care provider. SEEK MEDICAL CARE IF:  Your skin is pale or bluish in color.  You continue to feel nauseous or vomit.  Your pain is getting worse and is not helped by  medicine.  You have bleeding or swelling.  You are still sleepy or feeling clumsy after 24 hours. SEEK IMMEDIATE MEDICAL CARE IF:  You develop a rash.  You have difficulty breathing.  You develop any type of allergic problem.  You have a fever. MAKE SURE YOU:  Understand these instructions.  Will watch your condition.  Will get help right away if you are not doing well or get worse.   This information is not intended to replace advice given to you by your health care provider. Make sure you discuss any questions you have with your health care provider.   Document Released: 09/29/2013 Document Revised: 12/30/2014 Document Reviewed: 09/29/2013 Elsevier Interactive Patient Education [employer]

## 2016-03-20 NOTE — Op Note (Signed)
[facility] Patient Name: Brett Rosario Procedure Date: 03/20/2016 12:31 PM MRN: [id number] Date of Birth: 1976-10-12 Attending MD: Lionel December , MD CSN: [id number] Age: 40 Admit Type: Outpatient Procedure:                Upper GI endoscopy Indications:              Iron deficiency anemia, Reflux esophagitis, Heme                            positive stool Providers:                Lionel December, MD, Nena Polio, RN, Burke Keels,                            Technician Referring MD:             Marylynn Pearson, MD Medicines:                Cetacaine spray, Meperidine 25 mg IV Complications:            No immediate complications. Estimated Blood Loss:     Estimated blood loss was minimal. Procedure:                Pre-Anesthesia Assessment:                           - Prior to the procedure, a History and Physical                            was performed, and patient medications and                            allergies were reviewed. The patient's tolerance of                            previous anesthesia was also reviewed. The risks                            and benefits of the procedure and the sedation                            options and risks were discussed with the patient.                            All questions were answered, and informed consent                            was obtained. Prior Anticoagulants: The patient has                            taken no previous anticoagulant or antiplatelet                            agents. ASA Grade Assessment: I - A normal, healthy  patient. After reviewing the risks and benefits,                            the patient was deemed in satisfactory condition to                            undergo the procedure.                           After obtaining informed consent, the endoscope was                            passed under direct vision. Throughout the                            procedure, the  patient's blood pressure, pulse, and                            oxygen saturations were monitored continuously. The                            [id number]) was introduced through the                            mouth, and advanced to the second part of duodenum.                            The upper GI endoscopy was accomplished without                            difficulty. The patient tolerated the procedure                            well. Scope In: 12:35:44 PM Scope Out: 12:41:33 PM Total Procedure Duration: 0 hours 5 minutes 49 seconds  Findings:      The examined esophagus was normal.      The Z-line was regular and was found 41 cm from the incisors.      The entire examined stomach was normal.      The duodenal bulb and second portion of the duodenum were normal.       Biopsies for histology were taken with a cold forceps for evaluation of       celiac disease. Estimated blood loss was minimal. Impression:               - Normal esophagus.                           - Z-line regular, 41 cm from the incisors.                           - Normal stomach.                           - Normal duodenal bulb and second portion of the  duodenum. Biopsied. Moderate Sedation:      Moderate (conscious) sedation was administered by the endoscopy nurse       and supervised by the endoscopist. The following parameters were       monitored: oxygen saturation, heart rate, blood pressure, CO2       capnography and response to care. Total physician intraservice time was       7 minutes. Recommendation:           - Patient has a contact number available for                            emergencies. The signs and symptoms of potential                            delayed complications were discussed with the                            patient. Return to normal activities tomorrow.                            Written discharge instructions were provided to the                             patient.                           - Resume previous diet today.                           - Continue present medications.                           - Await pathology results. Procedure Code(s):        --- Professional ---                           [phone], Esophagogastroduodenoscopy, flexible,                            transoral; with biopsy, single or multiple Diagnosis Code(s):        --- Professional ---                           D50.9, Iron deficiency anemia, unspecified                           K21.0, Gastro-esophageal reflux disease with                            esophagitis                           R19.5, Other fecal abnormalities CPT copyright 2016 American Medical Association. All rights reserved. The codes documented in this report are preliminary and upon coder review may  be revised to meet current compliance requirements. Lionel December, MD Lionel December, MD 03/20/2016 12:57:44 PM This report has been signed electronically. Number of Addenda: 0

## 2016-03-22 ENCOUNTER — Encounter (HOSPITAL_COMMUNITY): Payer: Self-pay | Admitting: Internal Medicine

## 2016-03-26 ENCOUNTER — Telehealth (INDEPENDENT_AMBULATORY_CARE_PROVIDER_SITE_OTHER): Payer: Self-pay | Admitting: *Deleted

## 2016-03-26 NOTE — Telephone Encounter (Signed)
Patient called returning Dr. Patty Sermonsehman's call.  [phone]  Brett Rosario

## 2016-03-28 NOTE — Telephone Encounter (Signed)
Dr.Rehman called and states that he has talked with the patient. We need to schedule a Givens Capsule for 04/05/2016 or 04/08/2016, Patient recently had procedure. Dx: IDA

## 2016-03-29 ENCOUNTER — Other Ambulatory Visit (INDEPENDENT_AMBULATORY_CARE_PROVIDER_SITE_OTHER): Payer: Self-pay | Admitting: *Deleted

## 2016-03-29 DIAGNOSIS — D509 Iron deficiency anemia, unspecified: Secondary | ICD-10-CM

## 2016-03-29 DIAGNOSIS — R195 Other fecal abnormalities: Secondary | ICD-10-CM

## 2016-03-29 NOTE — Telephone Encounter (Signed)
GIVENS capsule sch'd 04/08/16 at 70 30 (700), left detailed message for patient

## 2016-04-02 ENCOUNTER — Telehealth (INDEPENDENT_AMBULATORY_CARE_PROVIDER_SITE_OTHER): Payer: Self-pay | Admitting: *Deleted

## 2016-04-02 NOTE — Telephone Encounter (Signed)
Spoke to Mr Brett Rosario and he wants to cancel GIVENS capsule for now and once his dad is better from lung surgery [name] will call me to reschedule

## 2016-04-02 NOTE — Telephone Encounter (Signed)
Cannot do his Givens on 04/08/2016  Dad has spot on lung and is having surgery next week  Please call him and reschedule his test few weeks out.  [phone]

## 2016-04-08 ENCOUNTER — Encounter (HOSPITAL_COMMUNITY): Payer: Self-pay

## 2016-04-08 ENCOUNTER — Ambulatory Visit (HOSPITAL_COMMUNITY): Admit: 2016-04-08 | Payer: BLUE CROSS/BLUE SHIELD | Admitting: Internal Medicine

## 2016-04-08 SURGERY — IMAGING PROCEDURE, GI TRACT, INTRALUMINAL, VIA CAPSULE

## 2016-09-13 ENCOUNTER — Encounter (INDEPENDENT_AMBULATORY_CARE_PROVIDER_SITE_OTHER): Payer: Self-pay

## 2018-12-07 ENCOUNTER — Encounter (HOSPITAL_COMMUNITY): Payer: Self-pay | Admitting: Emergency Medicine

## 2018-12-07 ENCOUNTER — Emergency Department (HOSPITAL_COMMUNITY)
Admission: EM | Admit: 2018-12-07 | Discharge: 2018-12-07 | Disposition: A | Discharge status: Home / Self Care | Payer: BLUE CROSS/BLUE SHIELD | Attending: Emergency Medicine | Admitting: Emergency Medicine

## 2018-12-07 ENCOUNTER — Other Ambulatory Visit: Payer: Self-pay

## 2018-12-07 DIAGNOSIS — R739 Hyperglycemia, unspecified: Secondary | ICD-10-CM | POA: Insufficient documentation

## 2018-12-07 DIAGNOSIS — F17220 Nicotine dependence, chewing tobacco, uncomplicated: Secondary | ICD-10-CM | POA: Insufficient documentation

## 2018-12-07 DIAGNOSIS — I10 Essential (primary) hypertension: Secondary | ICD-10-CM | POA: Insufficient documentation

## 2018-12-07 DIAGNOSIS — R42 Dizziness and giddiness: Secondary | ICD-10-CM | POA: Diagnosis not present

## 2018-12-07 LAB — CBC WITH DIFFERENTIAL/PLATELET
Abs Immature Granulocytes: 0.02 10*3/uL (ref 0.00–0.07)
Basophils Absolute: 0 10*3/uL (ref 0.0–0.1)
Basophils Relative: 1 %
Eosinophils Absolute: 0.1 10*3/uL (ref 0.0–0.5)
Eosinophils Relative: 2 %
HCT: 43.8 % (ref 39.0–52.0)
Hemoglobin: 14.7 g/dL (ref 13.0–17.0)
Immature Granulocytes: 0 %
Lymphocytes Relative: 26 %
Lymphs Abs: 1.6 10*3/uL (ref 0.7–4.0)
MCH: 29.9 pg (ref 26.0–34.0)
MCHC: 33.6 g/dL (ref 30.0–36.0)
MCV: 89 fL (ref 80.0–100.0)
Monocytes Absolute: 0.4 10*3/uL (ref 0.1–1.0)
Monocytes Relative: 7 %
Neutro Abs: 4.1 10*3/uL (ref 1.7–7.7)
Neutrophils Relative %: 64 %
Platelets: 219 10*3/uL (ref 150–400)
RBC: 4.92 MIL/uL (ref 4.22–5.81)
RDW: 12.2 % (ref 11.5–15.5)
WBC: 6.3 10*3/uL (ref 4.0–10.5)
nRBC: 0 % (ref 0.0–0.2)

## 2018-12-07 LAB — BASIC METABOLIC PANEL
Anion gap: 11 (ref 5–15)
BUN: 15 mg/dL (ref 6–20)
CO2: 27 mmol/L (ref 22–32)
Calcium: 9.7 mg/dL (ref 8.9–10.3)
Chloride: 99 mmol/L (ref 98–111)
Creatinine, Ser: 0.85 mg/dL (ref 0.61–1.24)
GFR calc Af Amer: >60 mL/min (ref >60)
GFR calc non Af Amer: >60 mL/min (ref >60)
Glucose, Bld: 312 mg/dL — ABNORMAL HIGH (ref 70–99)
Potassium: 4.3 mmol/L (ref 3.5–5.1)
Sodium: 137 mmol/L (ref 135–145)

## 2018-12-07 LAB — CBG MONITORING, ED: Glucose-Capillary: 289 mg/dL — ABNORMAL HIGH (ref 70–99)

## 2018-12-07 LAB — TROPONIN I: Troponin I: <0.03 ng/mL (ref <0.03)

## 2018-12-07 MED ORDER — METFORMIN HCL 500 MG PO TABS: 500.0000 mg | ORAL_TABLET | Freq: Two times a day (BID) | ORAL | 0 refills | Status: AC

## 2018-12-07 MED ORDER — MECLIZINE HCL 12.5 MG PO TABS: 12.5000 mg | ORAL_TABLET | Freq: Three times a day (TID) | ORAL | 0 refills | Status: AC | PRN

## 2018-12-07 NOTE — Discharge Instructions (Signed)
As we discussed, though your blood sugar is running high, it is not dangerous at this time. I am starting a new medication, Metformin, to help bring your blood sugar down over time.   Please continue your medications and follow up with your regular doctor as recommended in these documents.  If you develop new or worsening symptoms that concern you, please return to the Emergency Department.

## 2018-12-07 NOTE — ED Provider Notes (Signed)
Emergency Department Provider Note   I have reviewed the triage vital signs and the nursing notes.   HISTORY  Chief Complaint Dizziness   HPI Brett Rosario is a 42 y.o. male with PMH of HTN presents to the emergency department for evaluation of elevated blood sugar.  Patient states that he initially presented with some occasional lightheadedness worse with movement.  He describes vertigo type symptoms.  He has long-standing decreased hearing in the left ear.  He has had some tenderness recently in that ear.  No fevers or syncope.  Symptoms last for only a few seconds.  He began to check his blood sugar at home and noticed blood sugar levels that were elevated into the 200s.  He denies any history of diabetes.  He is not experiencing abdominal pain, nausea, vomiting.  Denies any chest pain or shortness of breath.  Patient states that PCP drew labs and obtained an EKG but referred him to the emergency department for further evaluation.  Past Medical History:  Diagnosis Date  . Hypertension     Patient Active Problem List   Diagnosis Date Noted  . Rectal bleeding 02/26/2016    Past Surgical History:  Procedure Laterality Date  . CARDIAC ELECTROPHYSIOLOGY MAPPING AND ABLATION     in his 71s for a fast heart rate  . COLONOSCOPY N/A 03/20/2016   Procedure: COLONOSCOPY;  Surgeon: Malissa Hippo, MD;  Location: [facility];  Service: Endoscopy;  Laterality: N/A;  10:30  . ESOPHAGOGASTRODUODENOSCOPY N/A 03/20/2016   Procedure: ESOPHAGOGASTRODUODENOSCOPY (EGD);  Surgeon: Malissa Hippo, MD;  Location: [facility];  Service: Endoscopy;  Laterality: N/A;    Allergies Patient has no known allergies.  No family history on file.  Social History Social History   Tobacco Use  . Smoking status: Never Smoker  . Smokeless tobacco: Current User    Types: Snuff  Substance Use Topics  . Alcohol use: Yes    Comment: occasional  . Drug use: No    Review of Systems  Constitutional:  No fever/chills. Positive lightheadedness.  Eyes: No visual changes. ENT: No sore throat. Positive intermittent vertigo.  Cardiovascular: Denies chest pain. Respiratory: Denies shortness of breath. Gastrointestinal: No abdominal pain.  No nausea, no vomiting.  No diarrhea.  No constipation. Genitourinary: Negative for dysuria. Musculoskeletal: Negative for back pain. Skin: Negative for rash. Neurological: Negative for headaches, focal weakness or numbness.  10-point ROS otherwise negative.  ____________________________________________   PHYSICAL EXAM:  VITAL SIGNS: ED Triage Vitals  Enc Vitals Group     BP 12/07/18 1116 136/75     Pulse Rate 12/07/18 1116 80     Resp 12/07/18 1116 18     Temp 12/07/18 1116 98 F (36.7 C)     Temp Source 12/07/18 1116 Oral     SpO2 12/07/18 1116 99 %     Weight 12/07/18 1115 220 lb (99.8 kg)     Height 12/07/18 1115 6' (1.829 m)     Pain Score 12/07/18 1115 0   Constitutional: Alert and oriented. Well appearing and in no acute distress. Eyes: Conjunctivae are normal. PERRL.  Head: Atraumatic. Nose: No congestion/rhinnorhea. Mouth/Ears/Throat: Mucous membranes are moist. Normal TMs bilaterally.  Neck: No stridor.  Cardiovascular: Normal rate, regular rhythm. Good peripheral circulation. Grossly normal heart sounds.   Respiratory: Normal respiratory effort.  No retractions. Lungs CTAB. Gastrointestinal: Soft and nontender. No distention.  Musculoskeletal: No lower extremity tenderness nor edema. No gross deformities of extremities. Neurologic:  Normal  speech and language. No gross focal neurologic deficits are appreciated. No pronator drift. Normal finger-to-nose testing.  Skin:  Skin is warm, dry and intact. No rash noted.  ____________________________________________   LABS (all labs ordered are listed, but only abnormal results are displayed)  Labs Reviewed  BASIC METABOLIC PANEL - Abnormal; Notable for the following components:       Result Value   Glucose, Bld 312 (*)    All other components within normal limits  CBG MONITORING, ED - Abnormal; Notable for the following components:   Glucose-Capillary 289 (*)    All other components within normal limits  CBC WITH DIFFERENTIAL/PLATELET  TROPONIN I   ____________________________________________  EKG   EKG Interpretation  Date/Time:  Monday December 07 2018 14:22:02 EST Ventricular Rate:  72 PR Interval:    QRS Duration: 100 QT Interval:  379 QTC Calculation: 415 R Axis:   55 Text Interpretation:  Sinus rhythm Baseline wander in lead(s) II No STEMI.  Confirmed by Alona BeneLong, Joshua [phone]([id number]) on 12/07/2018 2:33:07 PM       ____________________________________________   PROCEDURES  Procedure(s) performed:   Procedures  None ____________________________________________   INITIAL IMPRESSION / ASSESSMENT AND PLAN / ED COURSE  Pertinent labs & imaging results that were available during my care of the patient were reviewed by me and considered in my medical decision making (see chart for details).  Patient presents to the emergency department for evaluation of intermittent vertigo symptoms.  Noting elevated blood sugars at home.  Labs from triage reviewed which showed no evidence of DKA.  PCP is requesting cardiovascular evaluation and including troponin.  EKG with no acute findings.  Troponin is pending.  Patient does have hyperglycemia here.  Discussed diet changes and weight loss which could improve his symptoms but do plan to start metformin with plan for close PCP follow up.   Troponin negative. Discussed new medication and PCP follow up plan.   At this time, I do not feel there is any life-threatening condition present. I have reviewed and discussed all results (EKG, imaging, lab, urine as appropriate), exam findings with patient. I have reviewed nursing notes and appropriate previous records.  I feel the patient is safe to be discharged home without  further emergent workup. Discussed usual and customary return precautions. Patient and family (if present) verbalize understanding and are comfortable with this plan.  Patient will follow-up with their primary care provider. If they do not have a primary care provider, information for follow-up has been provided to them. All questions have been answered.  ____________________________________________  FINAL CLINICAL IMPRESSION(S) / ED DIAGNOSES  Final diagnoses:  Hyperglycemia  Vertigo  Lightheadedness    NEW OUTPATIENT MEDICATIONS STARTED DURING THIS VISIT:  Discharge Medication List as of 12/07/2018  3:31 PM    START taking these medications   Details  meclizine (ANTIVERT) 12.5 MG tablet Take 1 tablet (12.5 mg total) by mouth 3 (three) times daily as needed for dizziness., Starting Mon 12/07/2018, Print    metFORMIN (GLUCOPHAGE) 500 MG tablet Take 1 tablet (500 mg total) by mouth 2 (two) times daily with a meal., Starting Mon 12/07/2018, Until Wed 01/06/2019, Print        Note:  This document was prepared using Dragon voice recognition software and may include unintentional dictation errors.  Alona BeneJoshua Long, MD Emergency Medicine    Long, Arlyss RepressJoshua G, MD 12/07/18 2021

## 2018-12-07 NOTE — ED Triage Notes (Addendum)
[facility] for intermittent dizziness x 2 months. AOx4. Pt has no complaints of dizziness at this time. Pt also reports elevated blood sugars up to 300s. No hx of DM.

## 2019-02-04 ENCOUNTER — Ambulatory Visit (INDEPENDENT_AMBULATORY_CARE_PROVIDER_SITE_OTHER): Payer: BLUE CROSS/BLUE SHIELD | Admitting: Otolaryngology

## 2019-02-04 DIAGNOSIS — H9313 Tinnitus, bilateral: Secondary | ICD-10-CM

## 2019-02-04 DIAGNOSIS — R42 Dizziness and giddiness: Secondary | ICD-10-CM

## 2019-02-04 DIAGNOSIS — H9042 Sensorineural hearing loss, unilateral, left ear, with unrestricted hearing on the contralateral side: Secondary | ICD-10-CM

## 2019-03-01 ENCOUNTER — Other Ambulatory Visit (HOSPITAL_COMMUNITY): Payer: Self-pay | Admitting: Otolaryngology

## 2019-03-01 ENCOUNTER — Other Ambulatory Visit: Payer: Self-pay | Admitting: Otolaryngology

## 2019-03-01 DIAGNOSIS — H9042 Sensorineural hearing loss, unilateral, left ear, with unrestricted hearing on the contralateral side: Secondary | ICD-10-CM

## 2019-03-01 DIAGNOSIS — IMO0001 Reserved for inherently not codable concepts without codable children: Secondary | ICD-10-CM

## 2019-03-04 ENCOUNTER — Other Ambulatory Visit: Payer: Self-pay

## 2019-03-04 ENCOUNTER — Ambulatory Visit (HOSPITAL_COMMUNITY)
Admission: RE | Admit: 2019-03-04 | Discharge: 2019-03-04 | Disposition: A | Discharge status: Home / Self Care | Payer: BLUE CROSS/BLUE SHIELD | Source: Ambulatory Visit | Attending: Otolaryngology | Admitting: Otolaryngology

## 2019-03-04 DIAGNOSIS — H9042 Sensorineural hearing loss, unilateral, left ear, with unrestricted hearing on the contralateral side: Secondary | ICD-10-CM | POA: Diagnosis present

## 2019-03-04 DIAGNOSIS — IMO0001 Reserved for inherently not codable concepts without codable children: Secondary | ICD-10-CM

## 2019-03-04 LAB — POCT I-STAT CREATININE: Creatinine, Ser: 0.9 mg/dL (ref 0.61–1.24)

## 2019-03-04 MED ORDER — GADOBUTROL 1 MMOL/ML IV SOLN
10.0000 mL | Freq: Once | INTRAVENOUS | Status: AC | PRN
  Administered 2019-03-04: 10 mL via INTRAVENOUS

## 2019-06-01 ENCOUNTER — Telehealth: Payer: Self-pay | Admitting: Diagnostic Neuroimaging

## 2019-06-01 NOTE — Telephone Encounter (Signed)
Pt gave consent for VV on the phone/ Pt understands that although there may be some limitations with this type of visit, we will take all precautions to reduce any security or privacy concerns.  Pt understands that this will be treated like an in office visit and we will file with pt's insurance, and there may be a patient responsible charge related to this service. Sent email with link to [email or web address]

## 2019-06-02 ENCOUNTER — Encounter: Payer: Self-pay | Admitting: Diagnostic Neuroimaging

## 2019-06-02 NOTE — Telephone Encounter (Signed)
Called patient and LVM requesting call back to update EMR.  

## 2019-06-07 ENCOUNTER — Encounter: Payer: Self-pay | Admitting: Orthopaedic Surgery

## 2019-06-07 ENCOUNTER — Ambulatory Visit (INDEPENDENT_AMBULATORY_CARE_PROVIDER_SITE_OTHER): Payer: BC Managed Care – PPO | Admitting: Diagnostic Neuroimaging

## 2019-06-07 ENCOUNTER — Encounter: Payer: Self-pay | Admitting: Diagnostic Neuroimaging

## 2019-06-07 ENCOUNTER — Other Ambulatory Visit: Payer: Self-pay

## 2019-06-07 DIAGNOSIS — H8102 Meniere's disease, left ear: Secondary | ICD-10-CM | POA: Diagnosis not present

## 2019-06-07 DIAGNOSIS — R42 Dizziness and giddiness: Secondary | ICD-10-CM

## 2019-06-07 NOTE — Telephone Encounter (Signed)
Call received from [facility], following up on whether patient still needs the referral appointment - states they have just started back up with conducting studies since covid - said doing about 1 per day. [phone], [phone]

## 2019-06-07 NOTE — Progress Notes (Signed)
[facility]  PATIENT: Brett CarboRicky Rosario DOB: 10/18/1976  REFERRING CLINICIAN: Teoh HISTORY FROM: patient REASON FOR VISIT: new consult    HISTORICAL  CHIEF COMPLAINT:  Chief Complaint  Patient presents with  . Dizziness  . Tinnitus  . Hearing Loss    HISTORY OF PRESENT ILLNESS:   43 year old male here for evaluation of vertigo.  October 2019 patient had onset of intermittent room spinning vertigo sensations associated with nausea and dizziness.  Symptoms could last between 1 and 4 hours.  Sometimes symptoms triggered by change in head position.  Patient had peak symptoms in January and February 2020.  He went to PCP and ENT.  MRI of the brain in March 2020 showed no acute findings except for nonspecific foci of gliosis.  Patient has been using meclizine, salt restriction, and has successfully lost 10 pounds of weight.  Patient also has chronic left ear hearing loss since age 43 years old.  Patient has been having some tinnitus in the left ear.  ENT thought patient had Mnire's disease.  Patient has been to vestibular PT without relief.  Overall symptoms are improving in the last 1 month.   REVIEW OF SYSTEMS: Full 14 system review of systems performed and negative with exception of: As per HPI.  ALLERGIES: No Known Allergies  HOME MEDICATIONS: Outpatient Medications Prior to Visit  Medication Sig Dispense Refill  . fenofibrate (TRICOR) 145 MG tablet Take 145 mg by mouth daily.    Marland Kitchen. lisinopril-hydrochlorothiazide (PRINZIDE,ZESTORETIC) 20-12.5 MG per tablet Take 1 tablet by mouth daily.    . meclizine (ANTIVERT) 12.5 MG tablet Take 1 tablet (12.5 mg total) by mouth 3 (three) times daily as needed for dizziness. 30 tablet 0  . metFORMIN (GLUCOPHAGE) 500 MG tablet Take 1 tablet (500 mg total) by mouth 2 (two) times daily with a meal. 60 tablet 0  . sertraline (ZOLOFT) 50 MG tablet Take 75 mg by mouth daily.     No facility-administered medications prior  to visit.     PAST MEDICAL HISTORY: Past Medical History:  Diagnosis Date  . Diabetes (HCC)   . Dizziness   . Hearing loss    left ear, since childhood  . Hypertension   . Tinnitus of both ears     PAST SURGICAL HISTORY: Past Surgical History:  Procedure Laterality Date  . CARDIAC ELECTROPHYSIOLOGY MAPPING AND ABLATION     in his 420s for a fast heart rate  . COLONOSCOPY N/A 03/20/2016   Procedure: COLONOSCOPY;  Surgeon: Malissa HippoNajeeb U Rehman, MD;  [facility]: [facility];  Service: Endoscopy;  Laterality: N/A;  10:30  . ESOPHAGOGASTRODUODENOSCOPY N/A 03/20/2016   Procedure: ESOPHAGOGASTRODUODENOSCOPY (EGD);  Surgeon: Malissa HippoNajeeb U Rehman, MD;  Location: [facility];  Service: Endoscopy;  Laterality: N/A;    FAMILY HISTORY: No family history on file.  SOCIAL HISTORY: Social History   Socioeconomic History  . Marital status: Single    Spouse name: Not on file  . Number of children: Not on file  . Years of education: Not on file  . Highest education level: Not on file  Occupational History  . Not on file  Social Needs  . Financial resource strain: Not on file  . Food insecurity    Worry: Not on file    Inability: Not on file  . Transportation needs    Medical: Not on file    Non-medical: Not on file  Tobacco Use  . Smoking status: Never Smoker  . Smokeless tobacco: Current User  Types: Snuff  Substance and Sexual Activity  . Alcohol use: Yes    Comment: occasional  . Drug use: No  . Sexual activity: Not on file  Lifestyle  . Physical activity    Days per week: Not on file    Minutes per session: Not on file  . Stress: Not on file  Relationships  . Social Musicianconnections    Talks on phone: Not on file    Gets together: Not on file    Attends religious service: Not on file    Active member of club or organization: Not on file    Attends meetings of clubs or organizations: Not on file    Relationship status: Not on file  . Intimate partner violence    Fear of  current or ex partner: Not on file    Emotionally abused: Not on file    Physically abused: Not on file    Forced sexual activity: Not on file  Other Topics Concern  . Not on file  Social History Narrative  . Not on file     PHYSICAL EXAM   VIDEO EXAM  GENERAL EXAM/CONSTITUTIONAL:  Vitals: There were no vitals filed for this visit.  There is no height or weight on file to calculate BMI. Wt Readings from Last 3 Encounters:  12/07/18 220 lb (99.8 kg)  03/20/16 230 lb (104.3 kg)  02/26/16 230 lb 6.4 oz (104.5 kg)     Patient is in no distress; well developed, nourished and groomed; neck is supple   NEUROLOGIC: MENTAL STATUS:  No flowsheet data found.  awake, alert, oriented to person, place and time  recent and remote memory intact  normal attention and concentration  language fluent, comprehension intact, naming intact  fund of knowledge appropriate  CRANIAL NERVE:   2nd, 3rd, 4th, 6th - visual fields full to confrontation, extraocular muscles intact, no nystagmus  5th - facial sensation symmetric  7th - facial strength symmetric  8th - hearing intact  11th - shoulder shrug symmetric  12th - tongue protrusion midline  MOTOR:   NO TREMOR; NO DRIFT IN BUE  SENSORY:   normal and symmetric to light touch  COORDINATION:   fine finger movements normal      DIAGNOSTIC DATA (LABS, IMAGING, TESTING) - I reviewed patient records, labs, notes, testing and imaging myself where available.  Lab Results  Component Value Date   WBC 6.3 12/07/2018   HGB 14.7 12/07/2018   HCT 43.8 12/07/2018   MCV 89.0 12/07/2018   PLT 219 12/07/2018      Component Value Date/Time   NA 137 12/07/2018 1143   K 4.3 12/07/2018 1143   CL 99 12/07/2018 1143   CO2 27 12/07/2018 1143   GLUCOSE 312 (H) 12/07/2018 1143   BUN 15 12/07/2018 1143   CREATININE 0.90 03/04/2019 1033   CALCIUM 9.7 12/07/2018 1143   GFRNONAA >60 12/07/2018 1143   GFRAA >60 12/07/2018 1143    No results found for: CHOL, HDL, LDLCALC, LDLDIRECT, TRIG, CHOLHDL No results found for: NWGN5AHGBA1C No results found for: VITAMINB12 No results found for: TSH   03/04/19 MRI brain (with and without) [I reviewed images myself and agree with interpretation except that the non-specific gliosis is mild, not advanced.  -VRP]  - Symmetric, premature for age, and moderately advanced supratentorial subcortical white matter signal abnormality. Chronic infection, vasculitis, or complicated migraine could all have this appearance. Demyelinating disease is unlikely. Despite the history of hypertension, the relative symmetry and lack  of periventricular white matter signal abnormality, is less favored. - No retrocochlear lesion is identified to correlate with LEFT sensorineural hearing loss.    ASSESSMENT AND PLAN  43 y.o. year old male here with:  Dx:  1. Dizziness   2. Meniere's disease of left ear     Virtual Visit via Video Note  I connected with Brett Rosario on 06/07/19 at  9:00 AM EDT by a video enabled telemedicine application and verified that I am speaking with the correct person using two identifiers.  Location: Patient: home  Provider: office    I discussed the limitations of evaluation and management by telemedicine and the availability of in person appointments. The patient expressed understanding and agreed to proceed.  I discussed the assessment and treatment plan with the patient. The patient was provided an opportunity to ask questions and all were answered. The patient agreed with the plan and demonstrated an understanding of the instructions.   The patient was advised to call back or seek an in-person evaluation if the symptoms worsen or if the condition fails to improve as anticipated.  I provided 30 minutes of non-face-to-face time during this encounter.   PLAN:  - likely menieres dz of left ear; continue with ENT treatments - monitor BP, sugar, lipids per PCP   Return for pending if symptoms worsen or fail to improve, return to PCP.    Penni Bombard, MD 9/48/5462, 7:03 AM Certified in Neurology, Neurophysiology and Neuroimaging  [facility] [address], [place], [place] [address] [phone]

## 2019-06-10 NOTE — Telephone Encounter (Signed)
This encounter was created in error - please disregard.

## 2020-04-27 ENCOUNTER — Other Ambulatory Visit: Payer: Self-pay

## 2020-04-27 ENCOUNTER — Encounter (HOSPITAL_COMMUNITY): Payer: Self-pay | Admitting: Physical Therapy

## 2020-04-27 ENCOUNTER — Ambulatory Visit (HOSPITAL_COMMUNITY): Payer: BC Managed Care – PPO | Attending: Otolaryngology | Admitting: Physical Therapy

## 2020-04-27 DIAGNOSIS — R42 Dizziness and giddiness: Secondary | ICD-10-CM | POA: Diagnosis present

## 2020-04-27 NOTE — Therapy (Signed)
[facility] [place] [address] [place], [place], [address] Phone: [phone]   Fax:  [phone]  Physical Therapy Evaluation  Patient Details  Name: Brett Rosario. MRN: [id number] Date of Birth: May 14, 1976 Referring Provider (PT): Dr. Newman Pies    Encounter Date: 04/27/2020  PT End of Session - 04/27/20 1612    Visit Number  1    Number of Visits  8    Date for PT Re-Evaluation  05/27/20    Authorization Type  BCBS    Progress Note Due on Visit  8    PT Start Time  1445    PT Stop Time  1525    PT Time Calculation (min)  40 min    Activity Tolerance  Patient tolerated treatment well    Behavior During Therapy  [facility] for tasks assessed/performed       Past Medical History:  Diagnosis Date  . Diabetes (HCC)   . Dizziness   . Hearing loss    left ear, since childhood  . Hypertension   . Tinnitus of both ears     Past Surgical History:  Procedure Laterality Date  . CARDIAC ELECTROPHYSIOLOGY MAPPING AND ABLATION     in his 62s for a fast heart rate  . COLONOSCOPY N/A 03/20/2016   Procedure: COLONOSCOPY;  Surgeon: Malissa Hippo, MD;  [facility];  Service: Endoscopy;  Laterality: N/A;  10:30  . ESOPHAGOGASTRODUODENOSCOPY N/A 03/20/2016   Procedure: ESOPHAGOGASTRODUODENOSCOPY (EGD);  Surgeon: Malissa Hippo, MD;  [facility];  Service: Endoscopy;  Laterality: N/A;    There were no vitals filed for this visit.   Subjective Assessment - 04/27/20 1445    Subjective  Brett Rosario states that he started having vertigo bad several years ago.  It would get so bad that he could not get out of bed then it would slowly get better.  Last week her had some tinnitus and the next day he was really bed.  If he is very still the sx are not bad but if he moves the sx really increase.    Pertinent History  Dx with Menieres    Limitations  Walking;Other (comment)    Patient Stated Goals  Less dizziness    Currently in Pain?   No/denies         [facility] PT Assessment - 04/27/20 0001      Assessment   Medical Diagnosis  Dizziness/ vertigo     Referring Provider (PT)  Dr. Newman Pies     Onset Date/Surgical Date  03/29/20    Prior Therapy  PT and HEP pt is currently doing these       Precautions   Precautions  None      Restrictions   Weight Bearing Restrictions  No      Balance Screen   Has the patient fallen in the past 6 months  No    Has the patient had a decrease in activity level because of a fear of falling?   No    Is the patient reluctant to leave their home because of a fear of falling?   No      Prior Function   Level of Independence  Independent    Vocation  Full time employment    Vocation Requirements  standing, twisting, turning       Cognition   Overall Cognitive Status  Within Functional Limits for tasks assessed  Observation/Other Assessments   Focus on Therapeutic Outcomes (FOTO)   53; 47% affected       ROM / Strength   AROM / PROM / Strength  --   cervical ROM functional      Balance   Balance Assessed  Yes      Standardized Balance Assessment   Standardized Balance Assessment  Dynamic Gait Index      Dynamic Gait Index   Level Surface  Mild Impairment    Change in Gait Speed  Mild Impairment    Gait with Horizontal Head Turns  Mild Impairment    Gait with Vertical Head Turns  Moderate Impairment    Gait and Pivot Turn  Moderate Impairment    Step Over Obstacle  Moderate Impairment    Step Around Obstacles  Mild Impairment    Steps  Mild Impairment    Total Score  13      Functional Gait  Assessment   Gait assessed   --           Vestibular Assessment - 04/27/20 0001      Symptom Behavior   Subjective history of current problem  If I get up to walk I'm off balance, if I turn everthing moves a little bit .  States if he puts pressure on the back of his head he feels better.    Type of Dizziness   Unsteady with head/body turns    Frequency of Dizziness   when he is moving     Duration of Dizziness  hours     Aggravating Factors  Activity in general    Relieving Factors  Head stationary    Progression of Symptoms  Better      Oculomotor Exam   Head shaking Horizontal  Absent    Head Shaking Vertical  Absent    Smooth Pursuits  Saccades    Saccades  Intact      Vestibulo-Ocular Reflex   VOR 1 Head Only (x 1 viewing)  negative     VOR to Slow Head Movement  Normal;Comment      Positional Testing   Dix-Hallpike  Dix-Hallpike Right;Dix-Hallpike Left    Sidelying Test  Sidelying Right;Sidelying Left      Dix-Hallpike Right   Dix-Hallpike Right Symptoms  No nystagmus      Dix-Hallpike Left   Dix-Hallpike Left Symptoms  No nystagmus      Sidelying Right   Sidelying Right Symptoms  No nystagmus      Sidelying Left   Sidelying Left Symptoms  Left nystagmus      Positional Sensitivities   Sit to Supine  Lightheadedness    Supine to Left Side  No dizziness    Supine to Right Side  No dizziness    Supine to Sitting  Mild dizziness    Right Hallpike  No dizziness    Up from Right Hallpike  Lightheadedness    Up from Left Hallpike  Lightheadedness    Nose to Right Knee  Mild dizziness    Right Knee to Sitting  No dizziness    Nose to Left Knee  Mild dizziness          Objective measurements completed on examination: See above findings.       Vestibular Treatment/Exercise - 04/27/20 0001      Vestibular Treatment/Exercise   Habituation Exercises  Seated Horizontal Head Turns;Comment      Seated Horizontal Head Turns   Number of Reps   10  360 degree Turns   COMMENT  nose to knee Rt and LT x  10 each             PT Education - 04/27/20 1517    Education Details  Hep    Person(s) Educated  Patient    Methods  Explanation    Comprehension  Verbalized understanding       PT Short Term Goals - 04/27/20 1621      PT SHORT TERM GOAL #1   Title  PT to be able to come sit to stand without feeling  unsteady    Time  2    Period  Weeks    Status  New    Target Date  05/11/20      PT SHORT TERM GOAL #2   Title  PT to feel confident picking up items off the floor    Time  2    Period  Weeks    Status  New        PT Long Term Goals - 04/27/20 1622      PT LONG TERM GOAL #1   Title  Pt to be able to walk and turn his head  on and off for conversation for a period of at least 30 minutes without increase sx of dizziness    Time  4    Period  Weeks    Status  New    Target Date  05/25/20      PT LONG TERM GOAL #2   Title  Pt to be I in an advance HEP to allow his  dizziness to be decreased by 80% to allow pt to feel confident returning to work    Time  4    Period  Weeks    Status  New             Plan - 04/27/20 1613    Clinical Impression Statement  Brett Rosario is a 44 yo male who has been having sx of dizziness for over a month now.  He states unless he is perfectly still he always has some amount of dizziness. He had this several years ago and it went away on it's own.  He is being referred to skilled PT for vestibular rehab.  Evaluation demonstrates normal ocular exam, normal cervical ROM, negative dix Hal-pike manuver B but positive sx with almost all motions.,    Personal Factors and Comorbidities  Past/Current Experience    Examination-Activity Limitations  Other;Locomotion Level    Examination-Participation Restrictions  Community Activity;Yard Work;Other;Cleaning   not able to work at this time   Conservation officer, historic buildings  Stable/Uncomplicated    Clinical Decision Making  Moderate    Rehab Potential  Fair    PT Frequency  2x / week    PT Duration  4 weeks    PT Treatment/Interventions  Therapeutic activities;Therapeutic exercise    PT Next Visit Plan  test single leg stance and begin as an exercise.  begin sit to stand as fast as possible for HEP, 180 turns for HEP.   Work on gait at various speeds with head turns and continue higher lever vestibular  activites.    PT Home Exercise Plan  head turns and head to knee.       Patient will benefit from skilled therapeutic intervention in order to improve the following deficits and impairments:  Decreased activity tolerance, Decreased balance, Dizziness, Difficulty walking  Visit Diagnosis: Dizziness and giddiness  Problem List Patient Active Problem List   Diagnosis Date Noted  . Rectal bleeding 02/26/2016    Rayetta Humphrey, PT CLT [phone] 04/27/2020, 4:24 PM  [facility] [address] [place], [place], [address] Phone: [phone]   Fax:  [phone]  Name: Mavin Dyke. MRN: [id number] Date of Birth: [birth year]

## 2020-04-27 NOTE — Patient Instructions (Signed)
Bending / Picking Up Objects    Sitting, slowly bend head down and pick up object on the floor. Return to upright position. Hold position until symptoms subside. Repeat __5__ times per session. Do __5Head Motion: Side to Side    Sitting, tilt head down 15-30, slowly move head to right with eyes open. Hold position until symptoms subside. Then, move head slowly to opposite side. Hold position until symptoms subside. Repeat __10__ times per session. Do 4____ sessions per day.  Copyright  VHI. All rights reserved.  __ sessions per day.  Copyright  VHI. All rights reserved.

## 2020-05-03 ENCOUNTER — Other Ambulatory Visit: Payer: Self-pay

## 2020-05-03 ENCOUNTER — Encounter (HOSPITAL_COMMUNITY): Payer: Self-pay

## 2020-05-03 ENCOUNTER — Ambulatory Visit (HOSPITAL_COMMUNITY): Payer: BC Managed Care – PPO

## 2020-05-03 DIAGNOSIS — R42 Dizziness and giddiness: Secondary | ICD-10-CM | POA: Diagnosis not present

## 2020-05-03 NOTE — Patient Instructions (Signed)
Knee Extension: Sit to Stand (Eccentric)    With eyes focus on object.  Stand up and sit down quickly.  Repeat 10x complete 2-3 times a day. Copyright  VHI. All rights reserved.   Forward Progression With 180 (Half) Turns    Walk making a slow half turn in place, leading with head and eyes, toward right every 10 steps. Walk in a straight path forward between turns. Repeat sequence 10 times per session. Do 1-2 sessions per day.  Copyright  VHI. All rights reserved.

## 2020-05-03 NOTE — Therapy (Signed)
[place], [place], [address] Phone: [phone]   Fax:  [phone]  Physical Therapy Treatment  Patient Details  Name: Brett Rosario. MRN: [id number] Date of Birth: 1976/09/06 Referring Provider (PT): Dr. Leta Baptist    Encounter Date: 05/03/2020  PT End of Session - 05/03/20 1537    Visit Number  2    Number of Visits  8    Date for PT Re-Evaluation  05/27/20    Authorization Type  BCBS    Progress Note Due on Visit  8    PT Start Time  1534    PT Stop Time  1615    PT Time Calculation (min)  41 min    Activity Tolerance  Patient tolerated treatment well    Behavior During Therapy  [facility] for tasks assessed/performed       Past Medical History:  Diagnosis Date  . Diabetes ([place])   . Dizziness   . Hearing loss    left ear, since childhood  . Hypertension   . Tinnitus of both ears     Past Surgical History:  Procedure Laterality Date  . CARDIAC ELECTROPHYSIOLOGY MAPPING AND ABLATION     in his 39s for a fast heart rate  . COLONOSCOPY N/A 03/20/2016   Procedure: COLONOSCOPY;  Surgeon: Rogene Houston, MD;  [facility];  Service: Endoscopy;  Laterality: N/A;  10:30  . ESOPHAGOGASTRODUODENOSCOPY N/A 03/20/2016   Procedure: ESOPHAGOGASTRODUODENOSCOPY (EGD);  Surgeon: Rogene Houston, MD;  [facility];  Service: Endoscopy;  Laterality: N/A;    There were no vitals filed for this visit.  Subjective Assessment - 05/03/20 1533    Subjective  Pt stated he has dizziness with movement and minimal dizziness when standing stiff.  Reports complaince with HEP MD gave him and has added the PT HEP too.    Pertinent History  Dx with Menieres    Patient Stated Goals  Less dizziness    Currently in Pain?  No/denies                        [facility] Adult PT Treatment/Exercise - 05/03/20 0001      Exercises   Exercises  Knee/Hip      Knee/Hip Exercises: Standing   SLS  5x Lt 8",  Rt7"    Gait Training  DGI activities, 180 turns, head turns, gait speed, stop/go.      Knee/Hip Exercises: Seated   Sit to Sand  10 reps   fast      Vestibular Treatment/Exercise - 05/03/20 0001      Vestibular Treatment/Exercise   Habituation Exercises  Comment;Standing Horizontal Head Turns;180 degree Turns      Seated Horizontal Head Turns   Number of Reps   --      Standing Horizontal Head Turns   Number of Reps   10    Symptom Description   no increased symptoms      180 degree Turns   Number of Reps   10    Symptom Description   no increased symptoms, pt stated object just moves; no spinning      360 degree Turns   COMMENT  nose to knee Rt and LT x  10 each             PT Education - 05/03/20 1540    Education Details  Reviewed goals, educated importance of HEP compliance  and assired compliance with HEP.    Person(s) Educated  Patient    Methods  Explanation    Comprehension  Verbalized understanding       PT Short Term Goals - 04/27/20 1621      PT SHORT TERM GOAL #1   Title  PT to be able to come sit to stand without feeling unsteady    Time  2    Period  Weeks    Status  New    Target Date  05/11/20      PT SHORT TERM GOAL #2   Title  PT to feel confident picking up items off the floor    Time  2    Period  Weeks    Status  New        PT Long Term Goals - 04/27/20 1622      PT LONG TERM GOAL #1   Title  Pt to be able to walk and turn his head  on and off for conversation for a period of at least 30 minutes without increase sx of dizziness    Time  4    Period  Weeks    Status  New    Target Date  05/25/20      PT LONG TERM GOAL #2   Title  Pt to be I in an advance HEP to allow his  dizziness to be decreased by 80% to allow pt to feel confident returning to work    Time  4    Period  Weeks    Status  New            Plan - 05/03/20 1616    Clinical Impression Statement  Reviewed goals and educated importance of HEP compliance.   Session focus on balance activities and vestibular rehab.  Pt able to complete all motion with no report of increased or decreased dizziness, stated more movement makes all objects move.  Upon standing still decreases movement.  No nystagmus noted.  Does present wiht staggered gait during head movements during gait, no therapist assistance required for LOB episodes, pt able to recover I.  Added additional exercises  to HEP.    Personal Factors and Comorbidities  Past/Current Experience    Examination-Activity Limitations  Other;Locomotion Level    Examination-Participation Restrictions  Community Activity;Yard Work;Other;Cleaning    Stability/Clinical Decision Making  Stable/Uncomplicated    Clinical Decision Making  Moderate    Rehab Potential  Fair    PT Frequency  2x / week    PT Duration  4 weeks    PT Treatment/Interventions  Therapeutic activities;Therapeutic exercise    PT Next Visit Plan  Continue SLS activities and progress balance.  Work on gait at various speeds with head turns and continue higher level vestibular activities.    PT Home Exercise Plan  head turns and head to knee.; 5/12: fast STS and 180 turns       Patient will benefit from skilled therapeutic intervention in order to improve the following deficits and impairments:  Decreased activity tolerance, Decreased balance, Dizziness, Difficulty walking  Visit Diagnosis: Dizziness and giddiness     Problem List Patient Active Problem List   Diagnosis Date Noted  . Rectal bleeding 02/26/2016   Becky Sax, LPTA/CLT; CBIS [phone]  Juel Burrow 05/03/2020, 5:28 PM  [facility] [address] [place], [place], [address] Phone: [phone]   Fax:  [phone]  Name: Brett Rosario. MRN:  [id number] Date of Birth: 1976/07/21

## 2020-05-09 ENCOUNTER — Other Ambulatory Visit: Payer: Self-pay

## 2020-05-09 ENCOUNTER — Ambulatory Visit (HOSPITAL_COMMUNITY): Payer: BC Managed Care – PPO

## 2020-05-09 ENCOUNTER — Encounter (HOSPITAL_COMMUNITY): Payer: Self-pay

## 2020-05-09 DIAGNOSIS — R42 Dizziness and giddiness: Secondary | ICD-10-CM | POA: Diagnosis not present

## 2020-05-09 NOTE — Therapy (Signed)
[place], [place], [address] Phone: [phone]   Fax:  [phone]  Physical Therapy Treatment  Patient Details  Name: Brett Rosario. MRN: [id number] Date of Birth: November 18, 1976 Referring Provider (PT): Dr. Leta Baptist    Encounter Date: 05/09/2020  PT End of Session - 05/09/20 1655    Visit Number  3    Number of Visits  8    Date for PT Re-Evaluation  05/27/20    Authorization Type  BCBS    Progress Note Due on Visit  8    PT Start Time  1647    PT Stop Time  1728    PT Time Calculation (min)  41 min    Equipment Utilized During Treatment  --   CGA, SBA during gait activities   Activity Tolerance  Patient tolerated treatment well    Behavior During Therapy  [facility] for tasks assessed/performed       Past Medical History:  Diagnosis Date  . Diabetes ([place])   . Dizziness   . Hearing loss    left ear, since childhood  . Hypertension   . Tinnitus of both ears     Past Surgical History:  Procedure Laterality Date  . CARDIAC ELECTROPHYSIOLOGY MAPPING AND ABLATION     in his 84s for a fast heart rate  . COLONOSCOPY N/A 03/20/2016   Procedure: COLONOSCOPY;  Surgeon: Rogene Houston, MD;  [facility];  Service: Endoscopy;  Laterality: N/A;  10:30  . ESOPHAGOGASTRODUODENOSCOPY N/A 03/20/2016   Procedure: ESOPHAGOGASTRODUODENOSCOPY (EGD);  Surgeon: Rogene Houston, MD;  [facility];  Service: Endoscopy;  Laterality: N/A;    There were no vitals filed for this visit.  Subjective Assessment - 05/09/20 1651    Subjective  Pt reports the dizziness continues with movement.  Stated he has been compliant with new HEPs wihtout questions.  Reports he played golf with friends, hard to see the ball.  Has apt wiht eye MD later this week.    Pertinent History  Dx with Menieres    Patient Stated Goals  Less dizziness    Currently in Pain?  No/denies                        [facility]  Adult PT Treatment/Exercise - 05/09/20 0001      Knee/Hip Exercises: Standing   SLS  5x RT 15", Lt 8"    SLS with Vectors  2x 10" with intermittent HHA    Gait Training  DGI activities, 180 turns, head turns, gait speed, stop/go.    Other Standing Knee Exercises  180 degrees 10x both directions (faster)    Other Standing Knee Exercises  Sidestep 3RT RTB head turns      Knee/Hip Exercises: Seated   Sit to Sand  10 reps;without UE support;2 sets   2nd set faster movements, no increased "wobbliness"              PT Short Term Goals - 04/27/20 1621      PT SHORT TERM GOAL #1   Title  PT to be able to come sit to stand without feeling unsteady    Time  2    Period  Weeks    Status  New    Target Date  05/11/20      PT SHORT TERM GOAL #2   Title  PT to feel confident picking up items off  the floor    Time  2    Period  Weeks    Status  New        PT Long Term Goals - 04/27/20 1622      PT LONG TERM GOAL #1   Title  Pt to be able to walk and turn his head  on and off for conversation for a period of at least 30 minutes without increase sx of dizziness    Time  4    Period  Weeks    Status  New    Target Date  05/25/20      PT LONG TERM GOAL #2   Title  Pt to be I in an advance HEP to allow his  dizziness to be decreased by 80% to allow pt to feel confident returning to work    Time  4    Period  Weeks    Status  New            Plan - 05/09/20 1742    Clinical Impression Statement  No change in visual "wobbiness" with increased gait or sit to stand speed, does increased with headturns.  Pt with difficulty with SLS, added resistance during sidestep with head turns and vector stance for gluteal strengthening to assist with SLS.  Did present with drifting gait during DGI activities, able to regain all LOB episodes indepdentenly    Personal Factors and Comorbidities  Past/Current Experience    Examination-Activity Limitations  Other;Locomotion Level     Examination-Participation Restrictions  Community Activity;Yard Work;Other;Cleaning    Stability/Clinical Decision Making  Stable/Uncomplicated    Clinical Decision Making  Moderate    Rehab Potential  Fair    PT Frequency  2x / week    PT Duration  4 weeks    PT Treatment/Interventions  Therapeutic activities;Therapeutic exercise    PT Next Visit Plan  Begin balance master next session.  Continue SLS activities and progress balance.  Work on gait at various speeds with head turns and continue higher level vestibular activities.    PT Home Exercise Plan  head turns and head to knee.; 5/12: fast STS and 180 turns       Patient will benefit from skilled therapeutic intervention in order to improve the following deficits and impairments:  Decreased activity tolerance, Decreased balance, Dizziness, Difficulty walking  Visit Diagnosis: Dizziness and giddiness     Problem List Patient Active Problem List   Diagnosis Date Noted  . Rectal bleeding 02/26/2016   Becky Sax, LPTA/CLT; CBIS [phone] Juel Burrow 05/09/2020, 5:49 PM  [place] [facility] [address] [place], [place], [address] Phone: [phone]   Fax:  [phone]  Name: Brett Rosario. MRN: [id number] Date of Birth: 1976/06/24

## 2020-05-10 ENCOUNTER — Ambulatory Visit (HOSPITAL_COMMUNITY): Payer: BC Managed Care – PPO

## 2020-05-10 ENCOUNTER — Encounter (HOSPITAL_COMMUNITY): Payer: Self-pay

## 2020-05-10 DIAGNOSIS — R42 Dizziness and giddiness: Secondary | ICD-10-CM

## 2020-05-10 NOTE — Therapy (Signed)
Brett Rosario, [place], [address] Phone: [phone]   Fax:  [phone]  Physical Therapy Treatment  Patient Details  Name: Brett Rosario. MRN: [id number] Date of Birth: 1976-08-02 Referring Provider (PT): Dr. Leta Baptist    Encounter Date: 05/10/2020  PT End of Session - 05/10/20 1627    Visit Number  4    Number of Visits  8    Date for PT Re-Evaluation  05/27/20    Authorization Type  BCBS    Progress Note Due on Visit  8    PT Start Time  1620    PT Stop Time  1658    PT Time Calculation (min)  38 min    Equipment Utilized During Treatment  --   SBA/CGA   Activity Tolerance  Patient tolerated treatment well    Behavior During Therapy  WFL for tasks assessed/performed       Past Medical History:  Diagnosis Date  . Diabetes ([place])   . Dizziness   . Hearing loss    left ear, since childhood  . Hypertension   . Tinnitus of both ears     Past Surgical History:  Procedure Laterality Date  . CARDIAC ELECTROPHYSIOLOGY MAPPING AND ABLATION     in his 68s for a fast heart rate  . COLONOSCOPY N/A 03/20/2016   Procedure: COLONOSCOPY;  Surgeon: Brett Houston, MD;  [facility];  Service: Endoscopy;  Laterality: N/A;  10:30  . ESOPHAGOGASTRODUODENOSCOPY N/A 03/20/2016   Procedure: ESOPHAGOGASTRODUODENOSCOPY (EGD);  Surgeon: Brett Houston, MD;  [facility];  Service: Endoscopy;  Laterality: N/A;    There were no vitals filed for this visit.  Subjective Assessment - 05/10/20 1622    Subjective  Pt reports dizziness continues with movement, stated he was helping with cars and unsteady gait mechanics.  Continues to work on habitation exercises daily.    Pertinent History  Dx with Menieres    Patient Stated Goals  Less dizziness    Currently in Pain?  No/denies                        [facility] Adult PT Treatment/Exercise - 05/10/20 0001      Knee/Hip Exercises: Standing   Other Standing Knee Exercises  Tandem stance wiht head turns          Balance Exercises - 05/10/20 1643      Balance Exercises: Standing   Tandem Stance  Eyes open;3 reps   head turns   SLS  5 reps   Rt 13", Lt 9" max    SLS with Vectors  3 reps;10 secs   intermittent HHA   Balance Master: Limits for Stability  Test 3:27    Balance Master: Dynamic  Test: A30, B: 69; C: 1, D: 0    Tandem Gait  Forward;2 reps;Other (comment)   head turns   Sidestepping  3 reps   minisquat with head turns   Sit to Stand  Standard surface;Limitations    Sit to Stand Limitations  fast with focus on 1 point    Other Standing Exercises  Fast 360 turns 5x each direction          PT Short Term Goals - 04/27/20 1621      PT SHORT TERM GOAL #1   Title  PT to be able to come sit to stand without feeling unsteady    Time  2    Period  Weeks    Status  New    Target Date  05/11/20      PT SHORT TERM GOAL #2   Title  PT to feel confident picking up items off the floor    Time  2    Period  Weeks    Status  New        PT Long Term Goals - 04/27/20 1622      PT LONG TERM GOAL #1   Title  Pt to be able to walk and turn his head  on and off for conversation for a period of at least 30 minutes without increase sx of dizziness    Time  4    Period  Weeks    Status  New    Target Date  05/25/20      PT LONG TERM GOAL #2   Title  Pt to be I in an advance HEP to allow his  dizziness to be decreased by 80% to allow pt to feel confident returning to work    Time  4    Period  Weeks    Status  New            Plan - 05/10/20 1700    Clinical Impression Statement  Pt reports improved gait with head turn confidence though does continues to have "wobbliness" vision with head turns.  Balance master testing complete this session.  Continued with vertibular habituation and DGI activities.  No nystamus noted during session, does continue to drift Rt and Lt with head turns during gait.  No reports  of increased dizziness with increased speed.    Personal Factors and Comorbidities  Past/Current Experience    Examination-Activity Limitations  Other;Locomotion Level    Examination-Participation Restrictions  Community Activity;Yard Work;Other;Cleaning    Stability/Clinical Decision Making  Stable/Uncomplicated    Clinical Decision Making  Moderate    Rehab Potential  Fair    PT Frequency  2x / week    PT Duration  4 weeks    PT Treatment/Interventions  Therapeutic activities;Therapeutic exercise    PT Next Visit Plan  Begin activites on balance beam.  Continues habituation exercises, SLS activities and progress balance.  Work on gait at various speeds with head turns and continue higher level vestibular activities.    PT Home Exercise Plan  head turns and head to knee.; 5/12: fast STS and 180 turns       Patient will benefit from skilled therapeutic intervention in order to improve the following deficits and impairments:  Decreased activity tolerance, Decreased balance, Dizziness, Difficulty walking  Visit Diagnosis: Dizziness and giddiness     Problem List Patient Active Problem List   Diagnosis Date Noted  . Rectal bleeding 02/26/2016   Becky Sax, LPTA/CLT; CBIS [phone]  Brett Rosario 05/10/2020, 5:54 PM  [place] [facility] [address] [place], [place], [address] Phone: [phone]   Fax:  [phone]  Name: Brett Rosario. MRN: [id number] Date of Birth: [birth year]

## 2020-05-16 ENCOUNTER — Ambulatory Visit (HOSPITAL_COMMUNITY): Payer: BC Managed Care – PPO | Admitting: Physical Therapy

## 2020-05-16 ENCOUNTER — Encounter (HOSPITAL_COMMUNITY): Payer: Self-pay | Admitting: Physical Therapy

## 2020-05-16 ENCOUNTER — Other Ambulatory Visit: Payer: Self-pay

## 2020-05-16 DIAGNOSIS — R42 Dizziness and giddiness: Secondary | ICD-10-CM | POA: Diagnosis not present

## 2020-05-16 NOTE — Therapy (Signed)
[facility] [address] [place], [place], [address] Phone: [phone]   Fax:  [phone]  Physical Therapy Treatment  Patient Details  Name: Brett Rosario. MRN: [id number] Date of Birth: February 23, 1976 Referring Provider (PT): Dr. Newman Pies    Encounter Date: 05/16/2020  PT End of Session - 05/16/20 1607    Visit Number  5    Number of Visits  8    Date for PT Re-Evaluation  05/27/20    Authorization Type  BCBS    Progress Note Due on Visit  8    PT Start Time  1610    PT Stop Time  1705    PT Time Calculation (min)  55 min    Equipment Utilized During Treatment  --   SBA/CGA   Activity Tolerance  Patient tolerated treatment well    Behavior During Therapy  WFL for tasks assessed/performed       Past Medical History:  Diagnosis Date  . Diabetes ([facility])   . Dizziness   . Hearing loss    left ear, since childhood  . Hypertension   . Tinnitus of both ears     Past Surgical History:  Procedure Laterality Date  . CARDIAC ELECTROPHYSIOLOGY MAPPING AND ABLATION     in his 52s for a fast heart rate  . COLONOSCOPY N/A 03/20/2016   Procedure: COLONOSCOPY;  Surgeon: Malissa Hippo, MD;  [facility];  Service: Endoscopy;  Laterality: N/A;  10:30  . ESOPHAGOGASTRODUODENOSCOPY N/A 03/20/2016   Procedure: ESOPHAGOGASTRODUODENOSCOPY (EGD);  Surgeon: Malissa Hippo, MD;  [facility];  Service: Endoscopy;  Laterality: N/A;    There were no vitals filed for this visit.  Subjective Assessment - 05/16/20 1710    Subjective  Patient reports he feels about the same. States that he has been doing all his exercises everyday, multiple times a day and his symptoms are the same (feels like the room is shaking back and forth when he moves his body (walks). States that he saw the eye doctor and he is referring him to a neuro eye specialist at [facility] but he hasn't heard from them. States that he is frustrated because he has been  out of work for 5 weeks, he wants to get back to work and that he only has so much time on FLMA.    Pertinent History  Dx with Menieres    Patient Stated Goals  Less dizziness    Currently in Pain?  No/denies         [facility] PT Assessment - 05/16/20 0001      Assessment   Medical Diagnosis  Dizziness/ vertigo                     [facility] Adult PT Treatment/Exercise - 05/16/20 0001      Knee/Hip Exercises: Supine   Other Supine Knee/Hip Exercises  eye ROM exercises - end range with eyes closed - in all directions    Other Supine Knee/Hip Exercises  self mobilization to suboccipital with towel- 5 minutes tolerated well       Manual Therapy   Manual Therapy  Joint mobilization;Soft tissue mobilization;Manual Traction    Manual therapy comments  all manual interventions performed independently of other interventions    Joint Mobilization  Rib mobilizations - medial, AP, PA grade II/III - pain/restrictions noted but no reproduction of symptoms; T-spine mobilization grade II/III - pain/restrictions noted but no reproduction of  symptoms; cervical mobilizations AP - grade II/III - pain/restrictions noted but no reproduction of symptoms; medial and AP mobilization of TMJ grade II/III - pain/restrictions on R but no reproduction of symptoms.     Soft tissue mobilization  STM to cervical paraspinals, suboccipital,  UT - tenderness noted but no reproduction of symptoms    Manual Traction  Cervical traction - no reproduction of symptoms.             PT Education - 05/16/20 1722    Education Details  educated patient in plan, answered all questions and reassured patient. Educated patient in how current interventions could be contributing to his current symptoms.    Person(s) Educated  Patient    Methods  Explanation    Comprehension  Verbalized understanding       PT Short Term Goals - 04/27/20 1621      PT SHORT TERM GOAL #1   Title  PT to be able to come sit to stand without  feeling unsteady    Time  2    Period  Weeks    Status  New    Target Date  05/11/20      PT SHORT TERM GOAL #2   Title  PT to feel confident picking up items off the floor    Time  2    Period  Weeks    Status  New        PT Long Term Goals - 04/27/20 1622      PT LONG TERM GOAL #1   Title  Pt to be able to walk and turn his head  on and off for conversation for a period of at least 30 minutes without increase sx of dizziness    Time  4    Period  Weeks    Status  New    Target Date  05/25/20      PT LONG TERM GOAL #2   Title  Pt to be I in an advance HEP to allow his  dizziness to be decreased by 80% to allow pt to feel confident returning to work    Time  4    Period  Weeks    Status  New            Plan - 05/16/20 1710    Clinical Impression Statement  Patient very frustrated during today's session. Assessed cervical and thoracic spine and ribs during today's session. No reproduction of symptoms with mobilizations but pain and restrictions noted. Educated patient in current plan of care, acknowledges patient's frustrations and answered all questions about current condition.    Personal Factors and Comorbidities  Past/Current Experience    Examination-Activity Limitations  Other;Locomotion Level    Examination-Participation Restrictions  Community Activity;Yard Work;Other;Cleaning    Stability/Clinical Decision Making  Stable/Uncomplicated    Rehab Potential  Fair    PT Frequency  2x / week    PT Duration  4 weeks    PT Treatment/Interventions  Therapeutic activities;Therapeutic exercise    PT Next Visit Plan  f/u with mobilizations and symptoms. deep breathing exercises.  Continues habituation exercises, SLS activities and progress balance.  Work on gait at various speeds with head turns and continue higher level vestibular activities.    PT Home Exercise Plan  head turns and head to knee.; 5/12: fast STS and 180 turns       Patient will benefit from skilled  therapeutic intervention in order to improve the following deficits and impairments:  Decreased activity tolerance, Decreased balance, Dizziness, Difficulty walking  Visit Diagnosis: Dizziness and giddiness     Problem List Patient Active Problem List   Diagnosis Date Noted  . Rectal bleeding 02/26/2016   5:24 PM, 05/16/20 Jerene Pitch, DPT Physical Therapy with [facility]  [phone] office  [place] [address] [place], [place], [address] Phone: [phone]   Fax:  [phone]  Name: Dvonte Gatliff. MRN: [id number] Date of Birth: June 18, 1976

## 2020-05-18 ENCOUNTER — Other Ambulatory Visit: Payer: Self-pay

## 2020-05-18 ENCOUNTER — Ambulatory Visit (HOSPITAL_COMMUNITY): Payer: BC Managed Care – PPO

## 2020-05-18 ENCOUNTER — Encounter (HOSPITAL_COMMUNITY): Payer: Self-pay

## 2020-05-18 DIAGNOSIS — R42 Dizziness and giddiness: Secondary | ICD-10-CM | POA: Diagnosis not present

## 2020-05-18 NOTE — Therapy (Signed)
[facility] [address] [place], [place], [address] Phone: [phone]   Fax:  [phone]  Physical Therapy Treatment  Patient Details  Name: Brett Rosario. MRN: [id number] Date of Birth: 1976/07/13 Referring Provider (PT): Dr. Newman Pies    Encounter Date: 05/18/2020  PT End of Session - 05/18/20 0927    Visit Number  6    Number of Visits  8    Date for PT Re-Evaluation  05/27/20    Authorization Type  BCBS    Progress Note Due on Visit  8    PT Start Time  0919    PT Stop Time  0958    PT Time Calculation (min)  39 min    Activity Tolerance  Patient tolerated treatment well    Behavior During Therapy  [facility] for tasks assessed/performed       Past Medical History:  Diagnosis Date  . Diabetes ([facility])   . Dizziness   . Hearing loss    left ear, since childhood  . Hypertension   . Tinnitus of both ears     Past Surgical History:  Procedure Laterality Date  . CARDIAC ELECTROPHYSIOLOGY MAPPING AND ABLATION     in his 5s for a fast heart rate  . COLONOSCOPY N/A 03/20/2016   Procedure: COLONOSCOPY;  Surgeon: Malissa Hippo, MD;  [facility];  Service: Endoscopy;  Laterality: N/A;  10:30  . ESOPHAGOGASTRODUODENOSCOPY N/A 03/20/2016   Procedure: ESOPHAGOGASTRODUODENOSCOPY (EGD);  Surgeon: Malissa Hippo, MD;  [facility];  Service: Endoscopy;  Laterality: N/A;    There were no vitals filed for this visit.  Subjective Assessment - 05/18/20 0924    Subjective  Pt stated he feels about the same which get frustrating because he is doing all his exercises daily and no change wiht the wobblieness.  Reports he has scheduled apt with neuromyelitis opticist in [place] in August. concerned that his FLMA will be over before the apt is scheduled and has to return to work.  Stated he has called lawyer today.    Pertinent History  Dx with Menieres    Patient Stated Goals  Less dizziness    Currently in Pain?  No/denies                         [facility] Adult PT Treatment/Exercise - 05/18/20 0001      Knee/Hip Exercises: Seated   Other Seated Knee/Hip Exercises  3D thoracic excursion 10x       Knee/Hip Exercises: Supine   Other Supine Knee/Hip Exercises  Diaphragmatic breathing 5 min      Knee/Hip Exercises: Prone   Other Prone Exercises  Diaphragmatic breathing 5 min      Manual Therapy   Manual Therapy  Soft tissue mobilization;Other (comment)    Manual therapy comments  all manual interventions performed independently of other interventions    Soft tissue mobilization  STM to cervical paraspinals, suboccipital,  UT - tenderness noted but no reproduction of symptoms    Manual Traction  Cervical traction - no reproduction of symptoms.    Other Manual Therapy  Suboccipital release 3x 60"             PT Education - 05/18/20 1635    Education Details  Educated diaphragmatic breathing wiht verbal and tactile cueing to improve    Methods  Explanation    Comprehension  Verbalized understanding  PT Short Term Goals - 04/27/20 1621      PT SHORT TERM GOAL #1   Title  PT to be able to come sit to stand without feeling unsteady    Time  2    Period  Weeks    Status  New    Target Date  05/11/20      PT SHORT TERM GOAL #2   Title  PT to feel confident picking up items off the floor    Time  2    Period  Weeks    Status  New        PT Long Term Goals - 04/27/20 1622      PT LONG TERM GOAL #1   Title  Pt to be able to walk and turn his head  on and off for conversation for a period of at least 30 minutes without increase sx of dizziness    Time  4    Period  Weeks    Status  New    Target Date  05/25/20      PT LONG TERM GOAL #2   Title  Pt to be I in an advance HEP to allow his  dizziness to be decreased by 80% to allow pt to feel confident returning to work    Time  4    Period  Weeks    Status  New            Plan - 05/18/20 1629    Clinical  Impression Statement  Began session instructing diaphragmatic breathing for chest mobility and attempted to down regulate CNS.  Manual STM complete to cervical musculature to address restricitons.  Added 3D thoracic excursion to improve spinal mobility.    Personal Factors and Comorbidities  Past/Current Experience    Examination-Activity Limitations  Other;Locomotion Level    Examination-Participation Restrictions  Community Activity;Yard Work;Other;Cleaning    Stability/Clinical Decision Making  Stable/Uncomplicated    Clinical Decision Making  Moderate    Rehab Potential  Fair    PT Frequency  2x / week    PT Duration  4 weeks    PT Treatment/Interventions  Therapeutic activities;Therapeutic exercise    PT Next Visit Plan  f/u with mobilizations and symptoms. deep breathing exercises.  Add diaphragmatic breathing to HEP.    PT Home Exercise Plan  head turns and head to knee.; 5/12: fast STS and 180 turns       Patient will benefit from skilled therapeutic intervention in order to improve the following deficits and impairments:  Decreased activity tolerance, Decreased balance, Dizziness, Difficulty walking  Visit Diagnosis: Dizziness and giddiness     Problem List Patient Active Problem List   Diagnosis Date Noted  . Rectal bleeding 02/26/2016   Ihor Austin, LPTA/CLT; CBIS [phone]  Aldona Lento 05/18/2020, 4:36 PM  [place] [address] [place], [place], [address] Phone: [phone]   Fax:  [phone]  Name: Brett Rosario. MRN: [id number] Date of Birth: 1976/05/14

## 2020-05-23 ENCOUNTER — Ambulatory Visit (HOSPITAL_COMMUNITY): Payer: BC Managed Care – PPO | Attending: Otolaryngology | Admitting: Physical Therapy

## 2020-05-23 ENCOUNTER — Other Ambulatory Visit: Payer: Self-pay

## 2020-05-23 DIAGNOSIS — R42 Dizziness and giddiness: Secondary | ICD-10-CM | POA: Diagnosis not present

## 2020-05-23 NOTE — Therapy (Signed)
[place], [place], [address] Phone: [phone]   Fax:  [phone]  Physical Therapy Treatment, Progress Noted  Patient Details  Name: Brett Rosario. MRN: [id number] Date of Birth: July 16, 1976 Referring Provider (PT): Dr. Leta Baptist   PHYSICAL THERAPY DISCHARGE SUMMARY  Visits from Start of Care: 7  Current functional level related to goals / functional outcomes: No goals met at this time    Remaining deficits: Continued symptoms   Education / Equipment: See below Plan: Patient agrees to discharge.  Patient goals were not met. Patient is being discharged due to lack of progress.  ?????       Encounter Date: 05/23/2020  PT End of Session - 05/23/20 1620    Visit Number  7    Number of Visits  8    Date for PT Re-Evaluation  05/27/20    Authorization Type  BCBS    Progress Note Due on Visit  8    PT Start Time  1617    PT Stop Time  1655    PT Time Calculation (min)  38 min    Activity Tolerance  Patient tolerated treatment well    Behavior During Therapy  WFL for tasks assessed/performed       Past Medical History:  Diagnosis Date  . Diabetes ([place])   . Dizziness   . Hearing loss    left ear, since childhood  . Hypertension   . Tinnitus of both ears     Past Surgical History:  Procedure Laterality Date  . CARDIAC ELECTROPHYSIOLOGY MAPPING AND ABLATION     in his 79s for a fast heart rate  . COLONOSCOPY N/A 03/20/2016   Procedure: COLONOSCOPY;  Surgeon: Rogene Houston, MD;  [facility];  Service: Endoscopy;  Laterality: N/A;  10:30  . ESOPHAGOGASTRODUODENOSCOPY N/A 03/20/2016   Procedure: ESOPHAGOGASTRODUODENOSCOPY (EGD);  Surgeon: Rogene Houston, MD;  [facility];  Service: Endoscopy;  Laterality: N/A;    There were no vitals filed for this visit.  Subjective Assessment - 05/23/20 1656    Subjective  States that he can't follow up with his new specialist July  27th. States that he is worried because his FMLA is up soon and that he is afraid he is going to lose his job. States he feels about 30% better since the start of therapy but still has the same symptoms with movement. States he is doing his exercises daily but they are not helping with his symptoms.    Pertinent History  Dx with Menieres    Limitations  Walking;Other (comment)         [facility] PT Assessment - 05/23/20 0001      Assessment   Medical Diagnosis  Dizziness/ vertigo     Referring Provider (PT)  Dr. Leta Baptist     Onset Date/Surgical Date  03/29/20      Observation/Other Assessments   Focus on Therapeutic Outcomes (FOTO)   46% function    was 56% function      Dynamic Gait Index   Level Surface  Mild Impairment    Change in Gait Speed  Mild Impairment    Gait with Horizontal Head Turns  Mild Impairment    Gait with Vertical Head Turns  Mild Impairment    Gait and Pivot Turn  Mild Impairment    Step Over Obstacle  Normal    Step Around Obstacles  Normal  Steps  Mild Impairment    Total Score  18                            PT Education - 05/23/20 1659    Education Details  in current condition, in FOTO score, in DGI score, in contiuned symptoms and lack of progress in symptoms    Person(s) Educated  Patient    Methods  Explanation    Comprehension  Verbalized understanding       PT Short Term Goals - 05/23/20 1639      PT SHORT TERM GOAL #1   Title  PT to be able to come sit to stand without feeling unsteady    Baseline  still unsteady when standing up real fast    Time  2    Period  Weeks    Status  Not Met    Target Date  05/11/20      PT SHORT TERM GOAL #2   Title  PT to feel confident picking up items off the floor    Baseline  Sometimes OK othertimes needs to catch himself    Time  2    Period  Weeks    Status  Not Met        PT Long Term Goals - 05/23/20 1640      PT LONG TERM GOAL #1   Title  Pt to be able to walk and turn  his head  on and off for conversation for a period of at least 30 minutes without increase sx of dizziness    Baseline  just turning head with moving or just walking straight still increase in symptoms    Time  4    Period  Weeks    Status  Not Met      PT LONG TERM GOAL #2   Title  Pt to be I in an advance HEP to allow his  dizziness to be decreased by 80% to allow pt to feel confident returning to work    Baseline  patient is independent in HEP but symptoms not reduced    Time  4    Period  Weeks    Status  Partially Met            Plan - 05/23/20 1656    Clinical Impression Statement  Patient for a progress note on this date. He has not made any progress in regards to his symptoms. His DGI score improved from 13/24 to 18/24 so his overall balance is getting better, but his symptoms continued. Patient is independent in home exercises at this time. Answered all questions and reviewed lack of progress in regards to symptoms and FOTO score with patient. Patient to discharge from PT at this time secondary to lack of progress and is referred back to MD.    Personal Factors and Comorbidities  Past/Current Experience    Examination-Activity Limitations  Other;Locomotion Level    Examination-Participation Restrictions  Community Activity;Yard Work;Other;Cleaning    Stability/Clinical Decision Making  Stable/Uncomplicated    Rehab Potential  Fair    PT Frequency  2x / week    PT Duration  4 weeks    PT Treatment/Interventions  Therapeutic activities;Therapeutic exercise    PT Next Visit Plan  pt to DC from PT to HEP at this time.    PT Home Exercise Plan  head turns and head to knee.; 5/12: fast STS and 180 turns  Patient will benefit from skilled therapeutic intervention in order to improve the following deficits and impairments:  Decreased activity tolerance, Decreased balance, Dizziness, Difficulty walking  Visit Diagnosis: Dizziness and giddiness     Problem List Patient  Active Problem List   Diagnosis Date Noted  . Rectal bleeding 02/26/2016   5:00 PM, 05/23/20 Jerene Pitch, DPT Physical Therapy with [facility]  [phone] office  [place] [address] [place], [place], [address] Phone: [phone]   Fax:  [phone]  Name: Brett Rosario. MRN: [id number] Date of Birth: 03-Sep-1976

## 2020-05-25 ENCOUNTER — Encounter (HOSPITAL_COMMUNITY): Payer: BC Managed Care – PPO

## 2020-05-30 ENCOUNTER — Encounter (HOSPITAL_COMMUNITY): Payer: BC Managed Care – PPO | Admitting: Physical Therapy

## 2020-06-01 ENCOUNTER — Ambulatory Visit (HOSPITAL_COMMUNITY): Payer: BC Managed Care – PPO

## 2021-02-25 IMAGING — MR MRI HEAD WITHOUT AND WITH CONTRAST
5 of 12 series · 27 of 48 positions shown · IV contrast (gadavist)
Comparison: None.

CLINICAL DATA: Vertigo for 3 months. No known injury. Asymmetric
LEFT sensorineural hearing loss.

EXAM:
MRI HEAD WITHOUT AND WITH CONTRAST
TECHNIQUE: Multiplanar, multiecho pulse sequences of the brain and surrounding
structures were obtained without and with intravenous contrast.
CONTRAST:  Gadavist 10 mL.

[Series 3: DWI · axial · 3.0mm · 0.72mm/px · z∈[-33,+125]mm · 7 of 54 slices shown (1 of 2)]
[im 1/54]
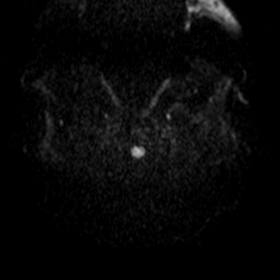
[im 9/54]
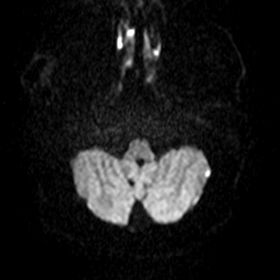
[im 18/54]
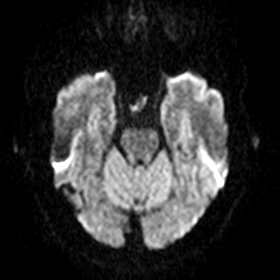
[im 27/54]
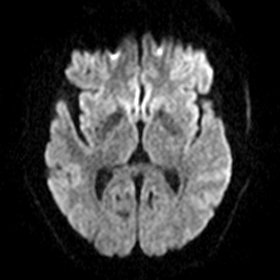
[im 36/54]
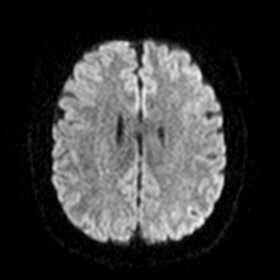
[im 45/54]
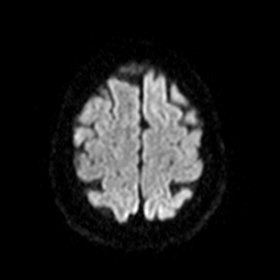
[im 54/54]
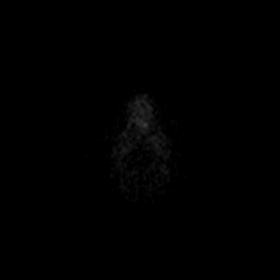

[Series 4: DWI · axial · 3.0mm · 0.76mm/px · z∈[-36,+125]mm · 6 of 55 slices shown (2 of 2)]
[im 1/55]
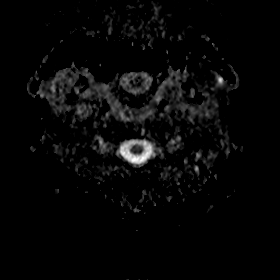
[im 11/55]
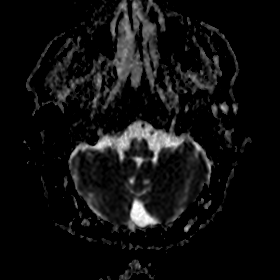
[im 22/55]
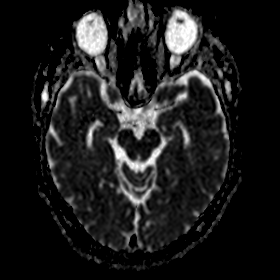
[im 33/55]
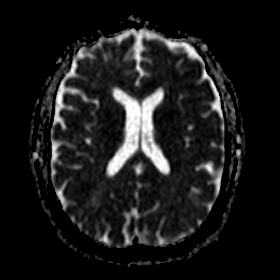
[im 44/55]
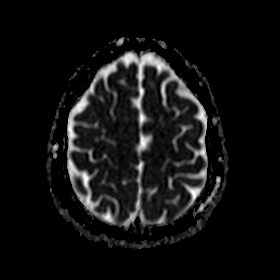
[im 55/55]
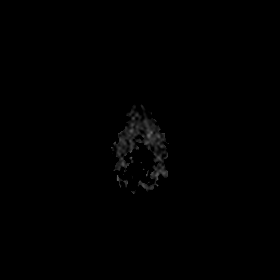

[Series 5: T2 · axial · 5.0mm · 0.42mm/px · 1 of 23 slices shown]
[im 1/23]
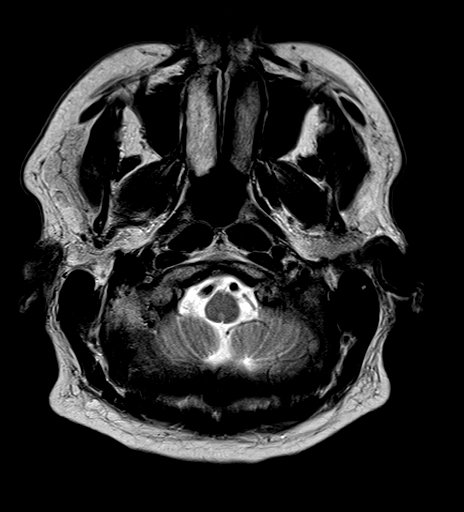

[Series 6: FLAIR · axial · 3.0mm · 0.35mm/px · z∈[-17,+133]mm · 5 of 51 slices shown]
[im 1/51]
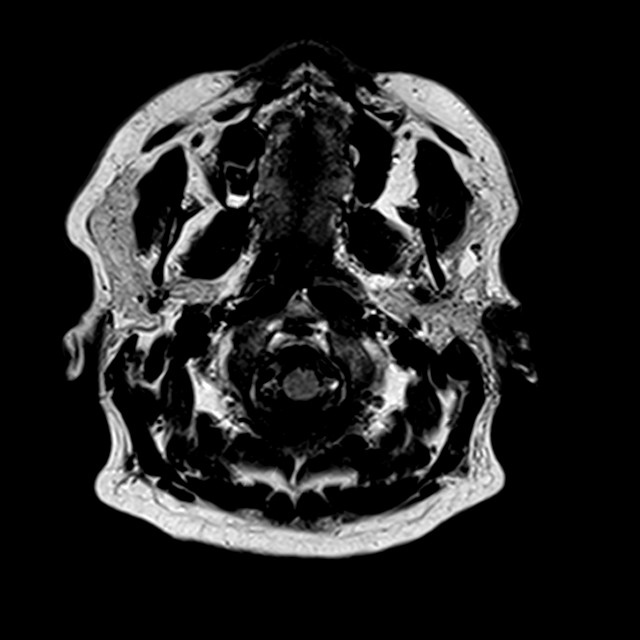
[im 13/51]
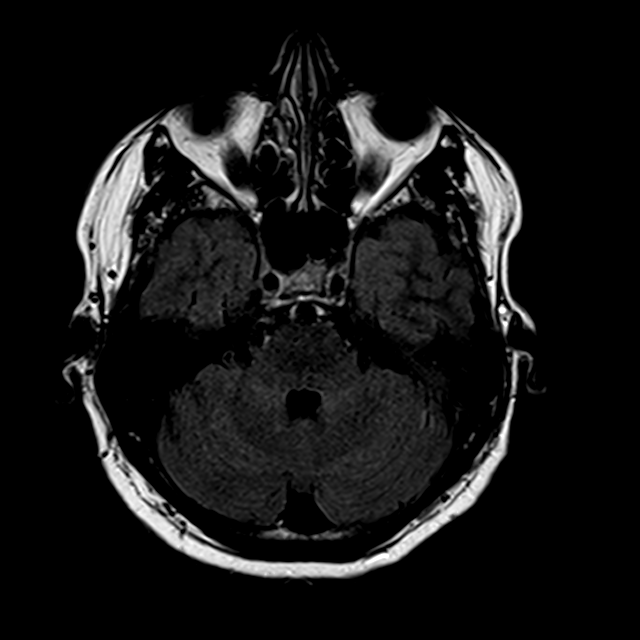
[im 26/51]
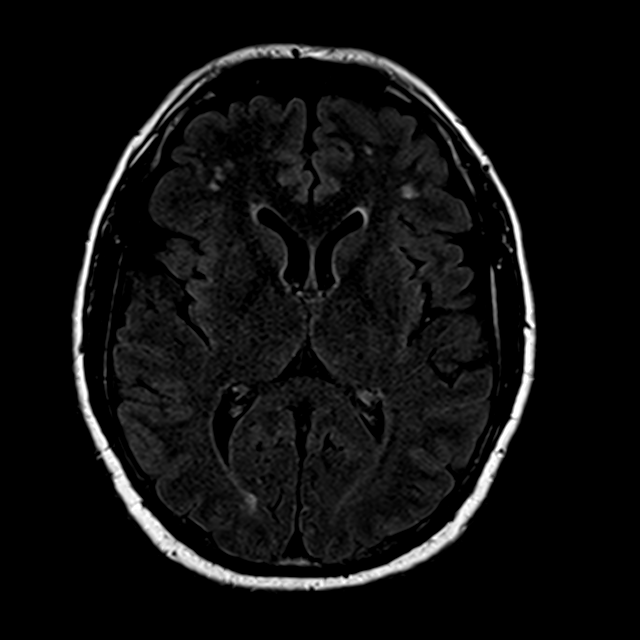
[im 38/51]
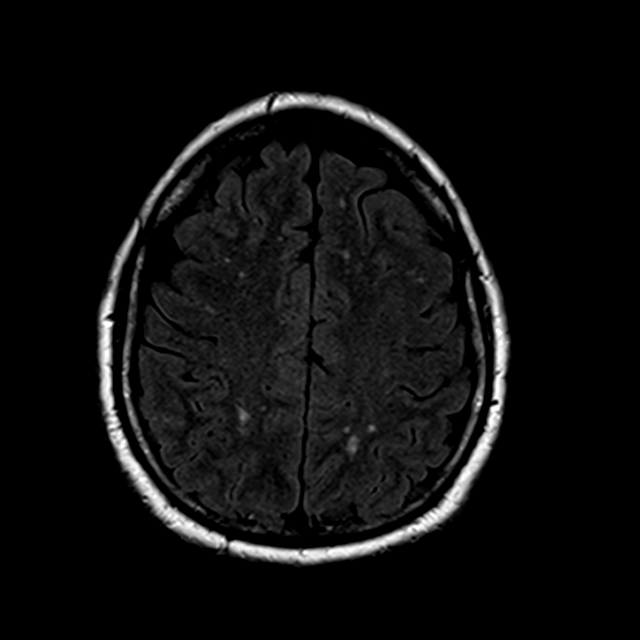
[im 51/51]
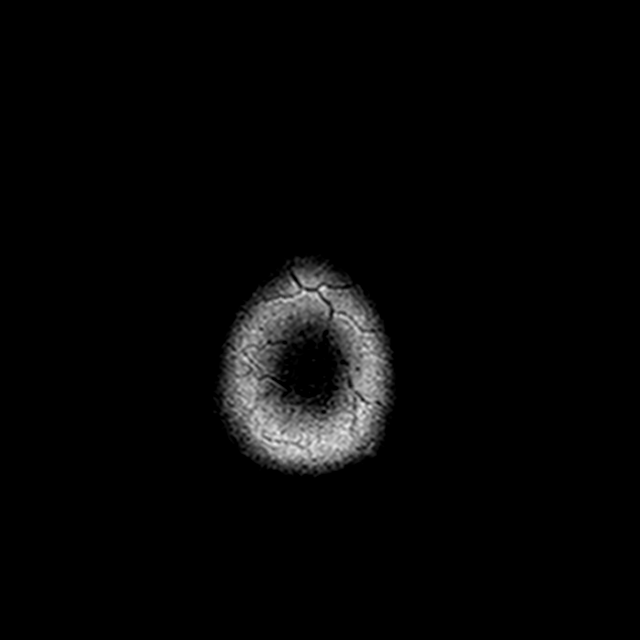

[Series 13: T1 post-contrast · axial · 2.0mm · 0.45mm/px · z∈[-19,+165]mm · 8 of 93 slices shown]
[im 1/93]
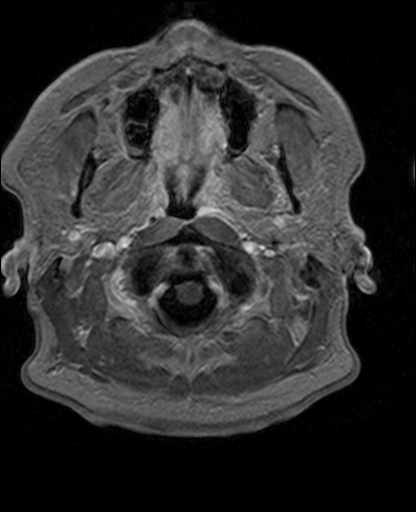
[im 11/93]
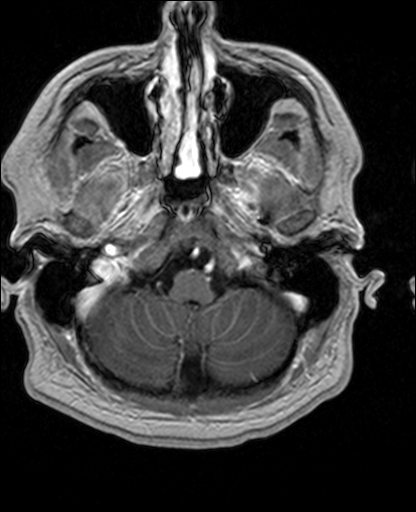
[im 31/93]
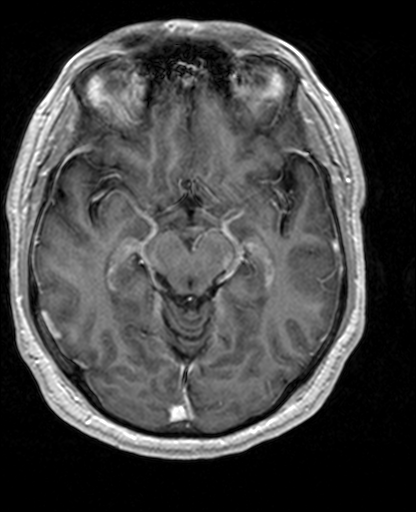
[im 41/93]
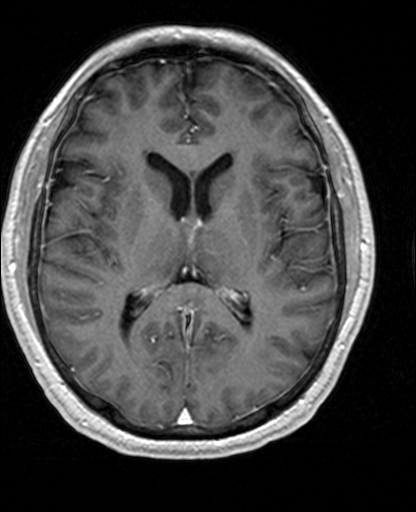
[im 52/93]
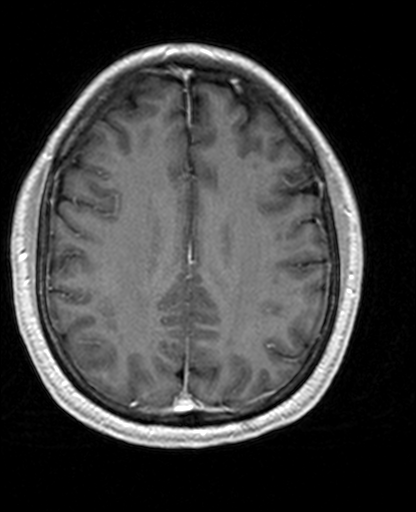
[im 62/93]
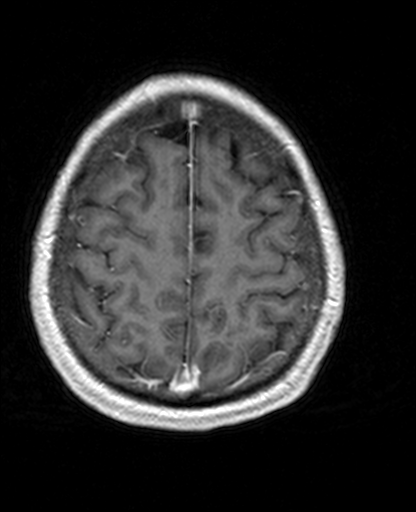
[im 82/93]
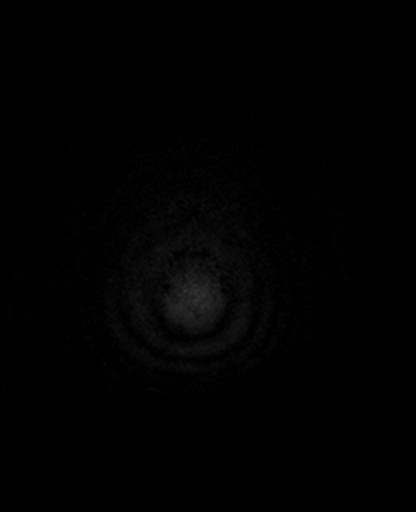
[im 93/93]
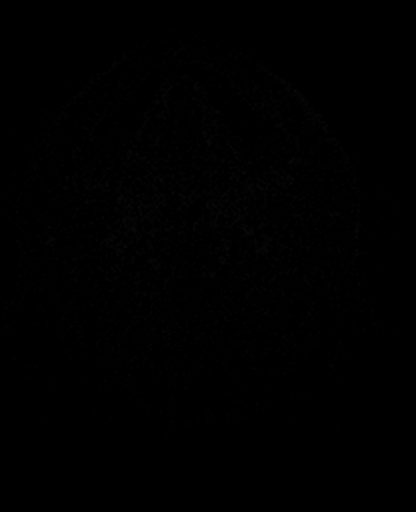

[27 of 48 positions shown; findings below may reference images not displayed]

FINDINGS: Brain: No evidence for acute infarction, hemorrhage, mass lesion,
hydrocephalus, or extra-axial fluid. Normal cerebral volume.

Symmetric, premature for age, and moderately advanced supratentorial
subcortical T2 and FLAIR white matter signal abnormality, sparing
the brainstem and cerebellum, is nonspecific. No lesions demonstrate
restriction.

Thin-section imaging through the posterior fossa demonstrates no
evidence of vestibular schwannoma or posterior fossa mass. No
temporal bone inflammatory process.

Post infusion imaging through the entire head demonstrates no
abnormal enhancement of brain or meninges.

Incidental retrocerebellar cisterna magna.

Vascular: Normal flow voids.

Skull and upper cervical spine: Normal marrow signal.

Sinuses/Orbits: Negative.

Other: None.
IMPRESSION: Symmetric, premature for age, and moderately advanced supratentorial
subcortical white matter signal abnormality. Chronic infection,
vasculitis, or complicated migraine could all have this appearance.
Demyelinating disease is unlikely. Despite the history of
hypertension, the relative symmetry and lack of periventricular
white matter signal abnormality, is less favored.

No retrocochlear lesion is identified to correlate with LEFT
sensorineural hearing loss.

## 2023-01-09 DIAGNOSIS — E782 Mixed hyperlipidemia: Secondary | ICD-10-CM | POA: Diagnosis not present

## 2023-01-09 DIAGNOSIS — F321 Major depressive disorder, single episode, moderate: Secondary | ICD-10-CM | POA: Diagnosis not present

## 2023-01-09 DIAGNOSIS — E1143 Type 2 diabetes mellitus with diabetic autonomic (poly)neuropathy: Secondary | ICD-10-CM | POA: Diagnosis not present

## 2023-01-09 DIAGNOSIS — F411 Generalized anxiety disorder: Secondary | ICD-10-CM | POA: Diagnosis not present

## 2023-01-09 DIAGNOSIS — E0843 Diabetes mellitus due to underlying condition with diabetic autonomic (poly)neuropathy: Secondary | ICD-10-CM | POA: Diagnosis not present

## 2023-01-09 DIAGNOSIS — E785 Hyperlipidemia, unspecified: Secondary | ICD-10-CM | POA: Diagnosis not present

## 2023-01-09 DIAGNOSIS — Z6830 Body mass index (BMI) 30.0-30.9, adult: Secondary | ICD-10-CM | POA: Diagnosis not present

## 2023-01-09 DIAGNOSIS — R69 Illness, unspecified: Secondary | ICD-10-CM | POA: Diagnosis not present

## 2023-01-09 DIAGNOSIS — I10 Essential (primary) hypertension: Secondary | ICD-10-CM | POA: Diagnosis not present

## 2023-01-13 DIAGNOSIS — E119 Type 2 diabetes mellitus without complications: Secondary | ICD-10-CM | POA: Diagnosis not present

## 2023-04-10 DIAGNOSIS — F321 Major depressive disorder, single episode, moderate: Secondary | ICD-10-CM | POA: Diagnosis not present

## 2023-04-10 DIAGNOSIS — F411 Generalized anxiety disorder: Secondary | ICD-10-CM | POA: Diagnosis not present

## 2023-04-10 DIAGNOSIS — E782 Mixed hyperlipidemia: Secondary | ICD-10-CM | POA: Diagnosis not present

## 2023-04-10 DIAGNOSIS — R69 Illness, unspecified: Secondary | ICD-10-CM | POA: Diagnosis not present

## 2023-04-10 DIAGNOSIS — I10 Essential (primary) hypertension: Secondary | ICD-10-CM | POA: Diagnosis not present

## 2023-04-10 DIAGNOSIS — Z0000 Encounter for general adult medical examination without abnormal findings: Secondary | ICD-10-CM | POA: Diagnosis not present

## 2023-04-10 DIAGNOSIS — Z6831 Body mass index (BMI) 31.0-31.9, adult: Secondary | ICD-10-CM | POA: Diagnosis not present

## 2023-04-10 DIAGNOSIS — E1143 Type 2 diabetes mellitus with diabetic autonomic (poly)neuropathy: Secondary | ICD-10-CM | POA: Diagnosis not present

## 2023-07-10 DIAGNOSIS — Z6830 Body mass index (BMI) 30.0-30.9, adult: Secondary | ICD-10-CM | POA: Diagnosis not present

## 2023-07-10 DIAGNOSIS — I10 Essential (primary) hypertension: Secondary | ICD-10-CM | POA: Diagnosis not present

## 2023-07-10 DIAGNOSIS — E782 Mixed hyperlipidemia: Secondary | ICD-10-CM | POA: Diagnosis not present

## 2023-07-10 DIAGNOSIS — F411 Generalized anxiety disorder: Secondary | ICD-10-CM | POA: Diagnosis not present

## 2023-07-10 DIAGNOSIS — Z0000 Encounter for general adult medical examination without abnormal findings: Secondary | ICD-10-CM | POA: Diagnosis not present

## 2023-07-10 DIAGNOSIS — F321 Major depressive disorder, single episode, moderate: Secondary | ICD-10-CM | POA: Diagnosis not present

## 2023-10-09 DIAGNOSIS — F331 Major depressive disorder, recurrent, moderate: Secondary | ICD-10-CM | POA: Diagnosis not present

## 2023-10-09 DIAGNOSIS — I10 Essential (primary) hypertension: Secondary | ICD-10-CM | POA: Diagnosis not present

## 2023-10-09 DIAGNOSIS — E782 Mixed hyperlipidemia: Secondary | ICD-10-CM | POA: Diagnosis not present

## 2023-10-09 DIAGNOSIS — Z6830 Body mass index (BMI) 30.0-30.9, adult: Secondary | ICD-10-CM | POA: Diagnosis not present

## 2023-10-09 DIAGNOSIS — E1143 Type 2 diabetes mellitus with diabetic autonomic (poly)neuropathy: Secondary | ICD-10-CM | POA: Diagnosis not present

## 2023-10-09 DIAGNOSIS — F411 Generalized anxiety disorder: Secondary | ICD-10-CM | POA: Diagnosis not present

## 2023-11-25 DIAGNOSIS — E785 Hyperlipidemia, unspecified: Secondary | ICD-10-CM | POA: Diagnosis not present

## 2023-11-25 DIAGNOSIS — T466X5S Adverse effect of antihyperlipidemic and antiarteriosclerotic drugs, sequela: Secondary | ICD-10-CM | POA: Diagnosis not present

## 2023-11-25 DIAGNOSIS — E119 Type 2 diabetes mellitus without complications: Secondary | ICD-10-CM | POA: Diagnosis not present

## 2023-11-25 DIAGNOSIS — Z7984 Long term (current) use of oral hypoglycemic drugs: Secondary | ICD-10-CM | POA: Diagnosis not present

## 2023-11-25 DIAGNOSIS — I10 Essential (primary) hypertension: Secondary | ICD-10-CM | POA: Diagnosis not present

## 2023-11-25 DIAGNOSIS — Z6831 Body mass index (BMI) 31.0-31.9, adult: Secondary | ICD-10-CM | POA: Diagnosis not present

## 2023-11-25 DIAGNOSIS — Z833 Family history of diabetes mellitus: Secondary | ICD-10-CM | POA: Diagnosis not present

## 2023-11-25 DIAGNOSIS — E669 Obesity, unspecified: Secondary | ICD-10-CM | POA: Diagnosis not present

## 2023-11-25 DIAGNOSIS — Z823 Family history of stroke: Secondary | ICD-10-CM | POA: Diagnosis not present

## 2023-11-25 DIAGNOSIS — G720 Drug-induced myopathy: Secondary | ICD-10-CM | POA: Diagnosis not present

## 2023-11-25 DIAGNOSIS — F329 Major depressive disorder, single episode, unspecified: Secondary | ICD-10-CM | POA: Diagnosis not present

## 2023-11-25 DIAGNOSIS — G8929 Other chronic pain: Secondary | ICD-10-CM | POA: Diagnosis not present

## 2024-01-15 DIAGNOSIS — F411 Generalized anxiety disorder: Secondary | ICD-10-CM | POA: Diagnosis not present

## 2024-01-15 DIAGNOSIS — I10 Essential (primary) hypertension: Secondary | ICD-10-CM | POA: Diagnosis not present

## 2024-01-15 DIAGNOSIS — E782 Mixed hyperlipidemia: Secondary | ICD-10-CM | POA: Diagnosis not present

## 2024-01-15 DIAGNOSIS — Z6830 Body mass index (BMI) 30.0-30.9, adult: Secondary | ICD-10-CM | POA: Diagnosis not present

## 2024-01-15 DIAGNOSIS — E1143 Type 2 diabetes mellitus with diabetic autonomic (poly)neuropathy: Secondary | ICD-10-CM | POA: Diagnosis not present

## 2024-01-15 DIAGNOSIS — F331 Major depressive disorder, recurrent, moderate: Secondary | ICD-10-CM | POA: Diagnosis not present

## 2024-04-22 DIAGNOSIS — F331 Major depressive disorder, recurrent, moderate: Secondary | ICD-10-CM | POA: Diagnosis not present

## 2024-04-22 DIAGNOSIS — E1143 Type 2 diabetes mellitus with diabetic autonomic (poly)neuropathy: Secondary | ICD-10-CM | POA: Diagnosis not present

## 2024-04-22 DIAGNOSIS — E782 Mixed hyperlipidemia: Secondary | ICD-10-CM | POA: Diagnosis not present

## 2024-04-22 DIAGNOSIS — Z6830 Body mass index (BMI) 30.0-30.9, adult: Secondary | ICD-10-CM | POA: Diagnosis not present

## 2024-04-22 DIAGNOSIS — F411 Generalized anxiety disorder: Secondary | ICD-10-CM | POA: Diagnosis not present

## 2024-04-22 DIAGNOSIS — I10 Essential (primary) hypertension: Secondary | ICD-10-CM | POA: Diagnosis not present

## 2024-07-07 DIAGNOSIS — I10 Essential (primary) hypertension: Secondary | ICD-10-CM | POA: Diagnosis not present

## 2024-07-07 DIAGNOSIS — E782 Mixed hyperlipidemia: Secondary | ICD-10-CM | POA: Diagnosis not present

## 2024-07-07 DIAGNOSIS — E1143 Type 2 diabetes mellitus with diabetic autonomic (poly)neuropathy: Secondary | ICD-10-CM | POA: Diagnosis not present

## 2024-10-06 DIAGNOSIS — E1143 Type 2 diabetes mellitus with diabetic autonomic (poly)neuropathy: Secondary | ICD-10-CM | POA: Diagnosis not present

## 2024-10-06 DIAGNOSIS — F411 Generalized anxiety disorder: Secondary | ICD-10-CM | POA: Diagnosis not present

## 2024-10-06 DIAGNOSIS — E782 Mixed hyperlipidemia: Secondary | ICD-10-CM | POA: Diagnosis not present

## 2024-10-06 DIAGNOSIS — Z6829 Body mass index (BMI) 29.0-29.9, adult: Secondary | ICD-10-CM | POA: Diagnosis not present

## 2024-10-06 DIAGNOSIS — F331 Major depressive disorder, recurrent, moderate: Secondary | ICD-10-CM | POA: Diagnosis not present

## 2024-10-06 DIAGNOSIS — I10 Essential (primary) hypertension: Secondary | ICD-10-CM | POA: Diagnosis not present

## 2024-10-22 NOTE — Progress Notes (Signed)
 Brett Rosario.                                          MRN: [id number]   10/22/2024   The [facility] Quality Team Specialist reviewed this patient medical record for the purposes of chart review for care gap closure. The following were reviewed: chart review for care gap closure-kidney health evaluation for diabetes:eGFR  and uACR.    [facility] Quality Team

## 2024-11-17 ENCOUNTER — Encounter (INDEPENDENT_AMBULATORY_CARE_PROVIDER_SITE_OTHER): Payer: Self-pay | Admitting: *Deleted
